# Patient Record
Sex: Male | Born: 1960 | Race: Black or African American | Hispanic: No | Smoking: Former smoker
Health system: Southern US, Community
[De-identification: ages and names within clinical notes are randomized; demographics above are authoritative.]

## PROBLEM LIST (undated history)

## (undated) DIAGNOSIS — F1910 Other psychoactive substance abuse, uncomplicated: Secondary | ICD-10-CM

## (undated) DIAGNOSIS — K759 Inflammatory liver disease, unspecified: Secondary | ICD-10-CM

## (undated) DIAGNOSIS — G4730 Sleep apnea, unspecified: Secondary | ICD-10-CM

## (undated) DIAGNOSIS — M1990 Unspecified osteoarthritis, unspecified site: Secondary | ICD-10-CM

## (undated) DIAGNOSIS — E119 Type 2 diabetes mellitus without complications: Secondary | ICD-10-CM

## (undated) DIAGNOSIS — I10 Essential (primary) hypertension: Secondary | ICD-10-CM

## (undated) DIAGNOSIS — E785 Hyperlipidemia, unspecified: Secondary | ICD-10-CM

## (undated) HISTORY — DX: Inflammatory liver disease, unspecified: K75.9

## (undated) HISTORY — PX: FINGER SURGERY: SHX640

## (undated) HISTORY — DX: Other psychoactive substance abuse, uncomplicated: F19.10

## (undated) HISTORY — DX: Hyperlipidemia, unspecified: E78.5

## (undated) HISTORY — DX: Sleep apnea, unspecified: G47.30

## (undated) HISTORY — PX: COLONOSCOPY: SHX5424

---

## 2010-09-16 ENCOUNTER — Inpatient Hospital Stay (INDEPENDENT_AMBULATORY_CARE_PROVIDER_SITE_OTHER)
Admission: RE | Admit: 2010-09-16 | Discharge: 2010-09-16 | Disposition: A | Discharge status: Home / Self Care | Payer: PRIVATE HEALTH INSURANCE | Source: Ambulatory Visit | Attending: Family Medicine | Admitting: Family Medicine

## 2010-09-16 ENCOUNTER — Ambulatory Visit (INDEPENDENT_AMBULATORY_CARE_PROVIDER_SITE_OTHER): Payer: PRIVATE HEALTH INSURANCE

## 2010-09-16 DIAGNOSIS — M549 Dorsalgia, unspecified: Secondary | ICD-10-CM

## 2011-07-02 ENCOUNTER — Ambulatory Visit: Payer: PRIVATE HEALTH INSURANCE | Admitting: Physical Medicine & Rehabilitation

## 2011-07-09 ENCOUNTER — Encounter: Payer: PRIVATE HEALTH INSURANCE | Admitting: Physical Medicine & Rehabilitation

## 2011-09-06 ENCOUNTER — Encounter
Payer: PRIVATE HEALTH INSURANCE | Attending: Physical Medicine & Rehabilitation | Admitting: Physical Medicine & Rehabilitation

## 2012-12-23 ENCOUNTER — Encounter (HOSPITAL_COMMUNITY): Payer: Self-pay | Admitting: Emergency Medicine

## 2012-12-23 ENCOUNTER — Emergency Department (HOSPITAL_COMMUNITY)
Admission: EM | Admit: 2012-12-23 | Discharge: 2012-12-23 | Disposition: A | Discharge status: Home / Self Care | Payer: Medicare Other | Attending: Emergency Medicine | Admitting: Emergency Medicine

## 2012-12-23 DIAGNOSIS — S0990XA Unspecified injury of head, initial encounter: Secondary | ICD-10-CM | POA: Insufficient documentation

## 2012-12-23 DIAGNOSIS — I10 Essential (primary) hypertension: Secondary | ICD-10-CM | POA: Insufficient documentation

## 2012-12-23 DIAGNOSIS — M545 Low back pain, unspecified: Secondary | ICD-10-CM | POA: Insufficient documentation

## 2012-12-23 DIAGNOSIS — E119 Type 2 diabetes mellitus without complications: Secondary | ICD-10-CM | POA: Insufficient documentation

## 2012-12-23 DIAGNOSIS — F17200 Nicotine dependence, unspecified, uncomplicated: Secondary | ICD-10-CM | POA: Insufficient documentation

## 2012-12-23 DIAGNOSIS — Z79899 Other long term (current) drug therapy: Secondary | ICD-10-CM | POA: Insufficient documentation

## 2012-12-23 DIAGNOSIS — M129 Arthropathy, unspecified: Secondary | ICD-10-CM | POA: Insufficient documentation

## 2012-12-23 HISTORY — DX: Essential (primary) hypertension: I10

## 2012-12-23 HISTORY — DX: Type 2 diabetes mellitus without complications: E11.9

## 2012-12-23 HISTORY — DX: Unspecified osteoarthritis, unspecified site: M19.90

## 2012-12-23 MED ORDER — IBUPROFEN 800 MG PO TABS: 400.0000 mg | ORAL_TABLET | Freq: Three times a day (TID) | ORAL | Status: DC

## 2012-12-23 MED ORDER — HYDROCHLOROTHIAZIDE 25 MG PO TABS: 25.0000 mg | ORAL_TABLET | Freq: Every day | ORAL | Status: DC

## 2012-12-23 MED ORDER — IBUPROFEN 800 MG PO TABS
800.0000 mg | ORAL_TABLET | Freq: Once | ORAL | Status: AC
  Administered 2012-12-23: 800 mg via ORAL
  Filled 2012-12-23: qty 1

## 2012-12-23 MED ORDER — HYDROCHLOROTHIAZIDE 12.5 MG PO CAPS
25.0000 mg | ORAL_CAPSULE | Freq: Once | ORAL | Status: AC
  Administered 2012-12-23: 25 mg via ORAL
  Filled 2012-12-23: qty 2

## 2012-12-23 NOTE — ED Provider Notes (Signed)
Medical screening examination/treatment/procedure(s) were performed by non-physician practitioner and as supervising physician I was immediately available for consultation/collaboration.  Kristen N Ward, DO 12/23/12 2331 
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
## 2013-08-19 ENCOUNTER — Ambulatory Visit
Admission: RE | Admit: 2013-08-19 | Discharge: 2013-08-19 | Disposition: A | Discharge status: Home / Self Care | Payer: Medicare PPO | Source: Ambulatory Visit | Attending: Family Medicine | Admitting: Family Medicine

## 2013-08-19 ENCOUNTER — Other Ambulatory Visit: Payer: Self-pay | Admitting: Family Medicine

## 2013-08-19 DIAGNOSIS — R52 Pain, unspecified: Secondary | ICD-10-CM

## 2013-09-28 ENCOUNTER — Institutional Professional Consult (permissible substitution): Payer: Medicare PPO | Admitting: Internal Medicine

## 2013-10-26 ENCOUNTER — Ambulatory Visit (INDEPENDENT_AMBULATORY_CARE_PROVIDER_SITE_OTHER): Payer: Commercial Managed Care - HMO | Admitting: Pulmonary Disease

## 2013-10-26 ENCOUNTER — Encounter: Payer: Self-pay | Admitting: Pulmonary Disease

## 2013-10-26 ENCOUNTER — Encounter (INDEPENDENT_AMBULATORY_CARE_PROVIDER_SITE_OTHER): Payer: Self-pay

## 2013-10-26 VITALS — BP 132/70 | HR 77 | Temp 97.9°F | Ht 70.0 in | Wt 276.8 lb

## 2013-10-26 DIAGNOSIS — G4733 Obstructive sleep apnea (adult) (pediatric): Secondary | ICD-10-CM

## 2013-10-26 NOTE — Patient Instructions (Signed)
Will schedule for home sleep testing, and will call you once results are available.  Work on weight loss

## 2013-10-26 NOTE — Assessment & Plan Note (Signed)
The patient's history is very suggestive of clinically significant sleep apnea. It had a long discussion with him about the pathophysiology of sleep disordered breathing, including its impact to his quality of life and cardiovascular health. He will need a sleep study for diagnosis, and he is an excellent candidate for home sleep testing. The patient is agreeable to this approach.

## 2013-10-26 NOTE — Progress Notes (Signed)
   Subjective:    Patient ID: Duane Davis, male    DOB: 1961-03-17, 53 y.o.   MRN: [id number]  HPI The patient is a 53 year old male who I've been asked to see for possible obstructive sleep apnea. He had a sleep study in [place] in 2006, but was not able to sleep during the night. He currently has been noted to have loud snoring as well as an abnormal breathing pattern during sleep. He has frequent awakenings at night, and is not rested in the mornings upon arising. He has significant daytime sleepiness with any period of inactivity, and can fall asleep watching TV or reading. He denies sleepiness with driving, but can doze had a long stoplight. The patient states that his weight is down 30 pounds over the last 2 years, and his Epworth score today is 12   Review of Systems  Constitutional: Negative for fever and unexpected weight change.  HENT: Positive for dental problem. Negative for congestion, ear pain, nosebleeds, postnasal drip, rhinorrhea, sinus pressure, sneezing, sore throat and trouble swallowing.   Eyes: Negative for redness and itching.  Respiratory: Positive for shortness of breath. Negative for cough, chest tightness and wheezing.   Cardiovascular: Negative for palpitations and leg swelling.  Gastrointestinal: Negative for nausea and vomiting.  Genitourinary: Negative for dysuria.  Musculoskeletal: Negative for joint swelling.  Skin: Negative for rash.  Neurological: Negative for headaches.  Hematological: Does not bruise/bleed easily.  Psychiatric/Behavioral: Negative for dysphoric mood. The patient is not nervous/anxious.        Objective:   Physical Exam Constitutional:  Obese male, no acute distress  HENT:  Nares patent without discharge  Oropharynx without exudate, palate and uvula are very thick and elongated.  Narrow posterior space  Eyes:  Perrla, eomi, no scleral icterus  Neck:  No JVD, no TMG  Cardiovascular:  Normal rate, regular rhythm, no rubs or  gallops.  No murmurs        Intact distal pulses  Pulmonary :  Normal breath sounds, no stridor or respiratory distress   No rales, rhonchi, or wheezing  Abdominal:  Soft, nondistended, bowel sounds present.  No tenderness noted.   Musculoskeletal:  mild lower extremity edema noted.  Lymph Nodes:  No cervical lymphadenopathy noted  Skin:  No cyanosis noted  Neurologic:  Alert, appropriate, moves all 4 extremities without obvious deficit.         Assessment & Plan:

## 2013-11-10 ENCOUNTER — Telehealth: Payer: Self-pay | Admitting: Internal Medicine

## 2013-11-11 NOTE — Telephone Encounter (Signed)
Mailbox full can not leave a message Joellen Jersey

## 2013-11-15 NOTE — Telephone Encounter (Signed)
This order and info is in [facility] Joellen Jersey

## 2013-11-15 NOTE — Telephone Encounter (Signed)
[name] ° °

## 2013-11-16 NOTE — Telephone Encounter (Signed)
Pt aware ins does not required precert for HST and he will be called in the next few weeks to get set up with machine Joellen Jersey

## 2013-12-07 ENCOUNTER — Telehealth: Payer: Self-pay | Admitting: Pulmonary Disease

## 2013-12-07 NOTE — Telephone Encounter (Signed)
Spoke with the pt and notified of results per CDY  Pt verbalized understanding  Will forward to [facility] to f/u on when he return to the office

## 2013-12-07 NOTE — Telephone Encounter (Signed)
I have looked over the sleep study. He has mild obstructive sleep apnea with a score of 8.3/ hr. Up to 5 is normal, up to 15 is still considered mild, up to 30 is moderate and over 30 would be severe. This is not life-threatening, so I will leave formal interpetation of the study and discussion of options with the patient to be done by Dr Gwenette Greet when he gets back on Monday.

## 2013-12-07 NOTE — Telephone Encounter (Signed)
Pt requesting results of home sleep test performed last week.  Pt aware that Dr Gwenette Greet is not in office, will return next week 12/13/13 Pt states that he does not want to wait that long for the results d/t his increased lack of sleep.  Would like to know if someone can read these results for Dr Gwenette Greet.  Please advise Dr Annamaria Boots as Dr Gwenette Greet is not in office. Thanks.

## 2013-12-13 NOTE — Telephone Encounter (Signed)
Pt aware [name] is seeing patients and we will call him when [facility] advises on recs. Please advise thanks

## 2013-12-13 NOTE — Telephone Encounter (Signed)
Pt is calling back [phone]

## 2013-12-17 NOTE — Telephone Encounter (Signed)
Spoke with pt-- states that he repeated the study last night and turned in the machine this morning. Per pt, this was the 2nd test he's done over night.  Advised the patient there is a short wait period on these results as they have to be loaded into the computer then the Doctor has to read the study.  Will send to Dr Gwenette Greet as Juluis Rainier that the patient has already completed the repeat study last night (12-16-13) and would like the results.

## 2013-12-17 NOTE — Telephone Encounter (Signed)
Have already sent a note to [facility] that his study needs to be repeated. They may have already contacted him.

## 2013-12-17 NOTE — Telephone Encounter (Signed)
Noted  

## 2013-12-17 NOTE — Telephone Encounter (Signed)
Dr Gwenette Greet please advise on sleep study results, thank you.

## 2013-12-22 ENCOUNTER — Other Ambulatory Visit: Payer: Self-pay | Admitting: Pulmonary Disease

## 2013-12-22 DIAGNOSIS — G4733 Obstructive sleep apnea (adult) (pediatric): Secondary | ICD-10-CM

## 2014-02-01 ENCOUNTER — Ambulatory Visit (HOSPITAL_BASED_OUTPATIENT_CLINIC_OR_DEPARTMENT_OTHER): Payer: Medicare HMO | Attending: Pulmonary Disease | Admitting: Radiology

## 2014-02-01 VITALS — Ht 70.0 in | Wt 272.0 lb

## 2014-02-01 DIAGNOSIS — G4733 Obstructive sleep apnea (adult) (pediatric): Secondary | ICD-10-CM | POA: Diagnosis not present

## 2014-02-11 DIAGNOSIS — G4730 Sleep apnea, unspecified: Secondary | ICD-10-CM

## 2014-02-11 NOTE — Progress Notes (Signed)
Needs ov to review sleep study 

## 2014-02-11 NOTE — Progress Notes (Signed)
appt scheduled 02/16/14. Nothing further needed

## 2014-02-11 NOTE — Sleep Study (Signed)
   NAME: Duane Davis DATE OF BIRTH:  1961/04/01 MEDICAL RECORD NUMBER [id number]  [facility]  PHYSICIAN: Zapata OF STUDY: 02/01/2014  SLEEP STUDY TYPE: Nocturnal Polysomnogram               REFERRING PHYSICIAN: Jamonta Goerner, Armando Reichert, MD  INDICATION FOR STUDY: Hypersomnia with sleep apnea  EPWORTH SLEEPINESS SCORE:  19 HEIGHT: 5\' 10"  (177.8 cm)  WEIGHT: 272 lb (123.378 kg)    Body mass index is 39.03 kg/(m^2).  NECK SIZE: 17.5 in.  MEDICATIONS: Reviewed in the sleep record  SLEEP ARCHITECTURE: The patient had a total sleep time of 346 minutes with no slow-wave sleep and only 68 minutes of REM. Sleep onset latency was normal at 9 minutes, and REM onset was normal at 43 minutes. Sleep efficiency was normal at 91%.  RESPIRATORY DATA:  The patient was found to have 16 apneas and 36 obstructive hypopneas, giving him an AHI of 9 events per hour. The events occurred in all body positions, but were definitely increased during REM. His REM AHI was 40 of events per hour. There was moderate snoring noted throughout.  OXYGEN DATA: There was oxygen desaturation as low as 84% with the patient's obstructive events  CARDIAC DATA: No clinically significant arrhythmias were seen  MOVEMENT/PARASOMNIA: No periodic limb movements or other abnormal behaviors were noted.  IMPRESSION/ RECOMMENDATION:    1) mild obstructive sleep apnea/hypopnea syndrome, with an AHI of 9 events per hour and oxygen desaturation as low as 84%. His events occurred primarily during REM, and his REM AHI was 42 events per hour. Treatment for this degree of sleep apnea can include a trial of weight loss alone, upper airway surgery, dental appliance, and also CPAP. Clinical correlation is suggested.    [place], American Board of Sleep Medicine  ELECTRONICALLY SIGNED ON:  02/11/2014, 8:54 AM [place] PH: [phone]   FX: [phone] [place]

## 2014-02-16 ENCOUNTER — Encounter: Payer: Self-pay | Admitting: Pulmonary Disease

## 2014-02-16 ENCOUNTER — Ambulatory Visit (INDEPENDENT_AMBULATORY_CARE_PROVIDER_SITE_OTHER): Payer: Commercial Managed Care - HMO | Admitting: Pulmonary Disease

## 2014-02-16 VITALS — BP 138/76 | HR 93 | Temp 97.8°F | Ht 70.0 in | Wt 282.4 lb

## 2014-02-16 DIAGNOSIS — G4733 Obstructive sleep apnea (adult) (pediatric): Secondary | ICD-10-CM

## 2014-02-16 NOTE — Assessment & Plan Note (Signed)
The patient has mild obstructive sleep apnea by his AHI, but the majority of his events occurred during REM. He is clearly very symptomatic with respect to his symptoms at night and during the day, and I would therefore recommend a trial of CPAP while working on weight loss. The patient is agreeable to this approach.

## 2014-02-16 NOTE — Patient Instructions (Signed)
Will start you on cpap with a moderate pressure level.  Please call if you are having any tolerance issues.  Work weight loss followup with me again in 8 weeks.

## 2014-02-16 NOTE — Progress Notes (Signed)
   Subjective:    Patient ID: Duane Davis, male    DOB: [birth year], 53 y.o.   MRN: [id number]  HPI The patient comes in today for followup of his recent sleep study.  He was found to have mild OSA, with an AHI 9 events per hour and a REM AHI of 40 of events per hour. I have reviewed the study with him in detail, and answered all of his questions.   Review of Systems  Constitutional: Negative for fever and unexpected weight change.  HENT: Negative for congestion, dental problem, ear pain, nosebleeds, postnasal drip, rhinorrhea, sinus pressure, sneezing, sore throat and trouble swallowing.   Eyes: Negative for redness and itching.  Respiratory: Negative for cough, chest tightness, shortness of breath and wheezing.   Cardiovascular: Negative for palpitations and leg swelling.  Gastrointestinal: Negative for nausea and vomiting.  Genitourinary: Negative for dysuria.  Musculoskeletal: Negative for joint swelling.  Skin: Negative for rash.  Neurological: Negative for headaches.  Hematological: Does not bruise/bleed easily.  Psychiatric/Behavioral: Negative for dysphoric mood. The patient is not nervous/anxious.        Objective:   Physical Exam Obese male in no acute distress Nose without purulence or discharge noted Neck without lymphadenopathy or thyromegaly Lower extremities with mild edema, no cyanosis Alert and oriented, moves all 4 extremities.       Assessment & Plan:

## 2014-03-28 ENCOUNTER — Ambulatory Visit: Payer: Commercial Managed Care - HMO | Admitting: Podiatry

## 2014-04-05 ENCOUNTER — Telehealth: Payer: Self-pay | Admitting: Pulmonary Disease

## 2014-04-05 DIAGNOSIS — G4733 Obstructive sleep apnea (adult) (pediatric): Secondary | ICD-10-CM

## 2014-04-05 NOTE — Telephone Encounter (Signed)
Spoke with pt, he is aware of download results.  He is wishing to change auto setting to 5-20.  Order placed.  Nothing further needed.

## 2014-04-05 NOTE — Telephone Encounter (Signed)
Let pt know that his download still shows a little bit of mask leak at times, but not too bad.  Keep working on fit.  He is having some breakthru apnea at times, but not much.  If he feels that he is not getting enough pressure, can change auto setting to 5-20cm so he has more pressure if needed.   If he is ok with this, please send order to dme.

## 2014-04-05 NOTE — Telephone Encounter (Signed)
Spoke with pt. He reports he is wearing CPAP nightly. Pt reports he is still not able to go into a deep sleep and is feeling very tired during the day. Does not seem pressure is enough. Download is in [facility] look at for recs. Please advise thanks

## 2014-04-12 ENCOUNTER — Encounter: Payer: Self-pay | Admitting: Pulmonary Disease

## 2014-04-12 ENCOUNTER — Ambulatory Visit (INDEPENDENT_AMBULATORY_CARE_PROVIDER_SITE_OTHER): Payer: Commercial Managed Care - HMO | Admitting: Pulmonary Disease

## 2014-04-12 VITALS — BP 124/90 | HR 77 | Temp 97.3°F | Ht 74.0 in | Wt 285.6 lb

## 2014-04-12 DIAGNOSIS — G4733 Obstructive sleep apnea (adult) (pediatric): Secondary | ICD-10-CM

## 2014-04-12 NOTE — Patient Instructions (Signed)
Keep working on mask fit, but do not be afraid to try something different if the fit is not right.  Let us know if you are having issues getting the fit right with your homecare company Work on weight loss followup with me again in 2mos.

## 2014-04-12 NOTE — Assessment & Plan Note (Signed)
The patient is doing much better with his C Pap device on the automatic setting. His download shows adequate compliance, and fairly good control of his AHI. Still having some breakthrough at times due to mask leak, and I have discussed with him troubleshooting his mask with his home care company. I've also encouraged him to work aggressively on weight loss.

## 2014-04-12 NOTE — Progress Notes (Signed)
   Subjective:    Patient ID: Duane Davis, male    DOB: July 20, 1960, 53 y.o.   MRN: [id number]  HPI The patient comes in today for follow-up of his obstructive sleep apnea. He was started on C but the last visit, and we have made adjustments to his pressure. He is wearing CPAP compliantly by his download, and has fairly good control of his AHI. He is having some mask leak at times which does cause him to have occasional breakthrough events. The patient states that he is sleeping much better with the device, and has seen significant improvement in his daytime alertness. He is having some issues with his mask fit which does occasionally cause leak.   Review of Systems  Constitutional: Negative for fever and unexpected weight change.  HENT: Negative for congestion, dental problem, ear pain, nosebleeds, postnasal drip, rhinorrhea, sinus pressure, sneezing, sore throat and trouble swallowing.   Eyes: Negative for redness and itching.  Respiratory: Negative for cough, chest tightness, shortness of breath and wheezing.   Cardiovascular: Negative for palpitations and leg swelling.  Gastrointestinal: Negative for nausea and vomiting.  Genitourinary: Negative for dysuria.  Musculoskeletal: Negative for joint swelling.  Skin: Negative for rash.  Neurological: Negative for headaches.  Hematological: Does not bruise/bleed easily.  Psychiatric/Behavioral: Negative for dysphoric mood. The patient is not nervous/anxious.        Objective:   Physical Exam Morbidly obese male in no acute distress Nose without purulence or discharge noted Neck without lymphadenopathy or thyromegaly No skin breakdown or pressure necrosis from the sleep apnea mask Lower extremities with edema noted, no cyanosis Alert and oriented, moves all 4 extremities.       Assessment & Plan:

## 2014-04-18 ENCOUNTER — Ambulatory Visit (INDEPENDENT_AMBULATORY_CARE_PROVIDER_SITE_OTHER): Payer: Commercial Managed Care - HMO

## 2014-04-18 ENCOUNTER — Encounter: Payer: Self-pay | Admitting: Podiatry

## 2014-04-18 ENCOUNTER — Ambulatory Visit (INDEPENDENT_AMBULATORY_CARE_PROVIDER_SITE_OTHER): Payer: Commercial Managed Care - HMO | Admitting: Podiatry

## 2014-04-18 VITALS — BP 147/94 | HR 79 | Resp 18

## 2014-04-18 DIAGNOSIS — M779 Enthesopathy, unspecified: Secondary | ICD-10-CM

## 2014-04-18 DIAGNOSIS — M79673 Pain in unspecified foot: Secondary | ICD-10-CM

## 2014-04-18 DIAGNOSIS — B351 Tinea unguium: Secondary | ICD-10-CM

## 2014-04-18 MED ORDER — TRIAMCINOLONE ACETONIDE 10 MG/ML IJ SUSP
10.0000 mg | Freq: Once | INTRAMUSCULAR | Status: AC
Start: 2014-04-18 — End: 2014-04-18
  Administered 2014-04-18: 10 mg

## 2014-04-18 NOTE — Progress Notes (Signed)
   Subjective:    Patient ID: Duane Davis, male    DOB: 12-01-1960, 53 y.o.   MRN: [id number]  HPI Comments: "Pain on the outside"  Patient c/o aching 5th MPJ right over left for several months. There are callused areas. Painful to walk a lot. He's been filing the calluses down, but no help.  Foot Pain      Review of Systems  Respiratory: Positive for shortness of breath.   Musculoskeletal: Positive for back pain and gait problem.  All other systems reviewed and are negative.      Objective:   Physical Exam        Assessment & Plan:

## 2014-04-19 NOTE — Progress Notes (Signed)
Subjective:     Patient ID: Duane Davis, male   DOB: 1961/03/13, 53 y.o.   MRN: [id number]  HPI patient is found to have pain underneath the fifth metatarsal head of both feet with inflammation and fluid buildup and lesion formation. Also is noted to have significant nail disease with thickness 1-5 both feet   Review of Systems  All other systems reviewed and are negative.      Objective:   Physical Exam  Constitutional: He is oriented to person, place, and time.  Cardiovascular: Intact distal pulses.   Musculoskeletal: Normal range of motion.  Neurological: He is oriented to person, place, and time.  Skin: Skin is warm and dry.  Nursing note and vitals reviewed.  neurovascular status found to be intact with muscle strength adequate and range of motion subtalar and midtarsal joint within normal limits. Patient is noted to have pain in the fifth metatarsal heads of both feet with fluid buildup underneath the plantar capsule and pain when pressed. 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 Today I went ahead and injected the plantar capsule fifth MPJ of both feet 3 mg Kenalog 5 mg Xylocaine and debrided the lesions and advised patient on nail care. Patient will be seen back as needed

## 2014-05-17 DIAGNOSIS — F1120 Opioid dependence, uncomplicated: Secondary | ICD-10-CM | POA: Diagnosis not present

## 2014-05-31 DIAGNOSIS — I10 Essential (primary) hypertension: Secondary | ICD-10-CM | POA: Diagnosis not present

## 2014-05-31 DIAGNOSIS — G4733 Obstructive sleep apnea (adult) (pediatric): Secondary | ICD-10-CM | POA: Diagnosis not present

## 2014-05-31 DIAGNOSIS — G4710 Hypersomnia, unspecified: Secondary | ICD-10-CM | POA: Diagnosis not present

## 2014-06-10 DIAGNOSIS — F1120 Opioid dependence, uncomplicated: Secondary | ICD-10-CM | POA: Diagnosis not present

## 2014-06-13 DIAGNOSIS — F1120 Opioid dependence, uncomplicated: Secondary | ICD-10-CM | POA: Diagnosis not present

## 2014-06-14 DIAGNOSIS — F1120 Opioid dependence, uncomplicated: Secondary | ICD-10-CM | POA: Diagnosis not present

## 2014-06-20 DIAGNOSIS — F1120 Opioid dependence, uncomplicated: Secondary | ICD-10-CM | POA: Diagnosis not present

## 2014-07-01 DIAGNOSIS — G4733 Obstructive sleep apnea (adult) (pediatric): Secondary | ICD-10-CM | POA: Diagnosis not present

## 2014-07-01 DIAGNOSIS — G4710 Hypersomnia, unspecified: Secondary | ICD-10-CM | POA: Diagnosis not present

## 2014-07-01 DIAGNOSIS — I10 Essential (primary) hypertension: Secondary | ICD-10-CM | POA: Diagnosis not present

## 2014-07-04 DIAGNOSIS — F1120 Opioid dependence, uncomplicated: Secondary | ICD-10-CM | POA: Diagnosis not present

## 2014-07-06 DIAGNOSIS — F1120 Opioid dependence, uncomplicated: Secondary | ICD-10-CM | POA: Diagnosis not present

## 2014-07-11 ENCOUNTER — Ambulatory Visit (INDEPENDENT_AMBULATORY_CARE_PROVIDER_SITE_OTHER): Payer: Commercial Managed Care - HMO | Admitting: Podiatry

## 2014-07-11 DIAGNOSIS — M779 Enthesopathy, unspecified: Secondary | ICD-10-CM | POA: Diagnosis not present

## 2014-07-11 DIAGNOSIS — M79673 Pain in unspecified foot: Secondary | ICD-10-CM

## 2014-07-11 DIAGNOSIS — F1120 Opioid dependence, uncomplicated: Secondary | ICD-10-CM | POA: Diagnosis not present

## 2014-07-11 MED ORDER — TRIAMCINOLONE ACETONIDE 10 MG/ML IJ SUSP
10.0000 mg | Freq: Once | INTRAMUSCULAR | Status: AC
Start: 2014-07-11 — End: 2014-07-11
  Administered 2014-07-11: 10 mg

## 2014-07-12 DIAGNOSIS — F1120 Opioid dependence, uncomplicated: Secondary | ICD-10-CM | POA: Diagnosis not present

## 2014-07-12 NOTE — Progress Notes (Signed)
Subjective:     Patient ID: Duane Davis, male   DOB: 1960/07/08, 54 y.o.   MRN: [id number]  HPI patient presents with inflammation around the right fifth metatarsal head with fluid buildup noted and pain with palpation   Review of Systems     Objective:   Physical Exam Neurovascular status intact with thick keratotic lesion fifth MPJ right and fluid buildup noted with palpation and visual    Assessment:     Inflammatory capsulitis fifth MPJ right with pain    Plan:     Injection of the right fifth MPJ 3 mg Dexon some Kenalog 5 mg Xylocaine and debrided lesion

## 2014-07-18 DIAGNOSIS — F1120 Opioid dependence, uncomplicated: Secondary | ICD-10-CM | POA: Diagnosis not present

## 2014-07-26 DIAGNOSIS — F1120 Opioid dependence, uncomplicated: Secondary | ICD-10-CM | POA: Diagnosis not present

## 2014-07-30 DIAGNOSIS — G4733 Obstructive sleep apnea (adult) (pediatric): Secondary | ICD-10-CM | POA: Diagnosis not present

## 2014-07-30 DIAGNOSIS — I10 Essential (primary) hypertension: Secondary | ICD-10-CM | POA: Diagnosis not present

## 2014-07-30 DIAGNOSIS — G4710 Hypersomnia, unspecified: Secondary | ICD-10-CM | POA: Diagnosis not present

## 2014-08-01 DIAGNOSIS — F1120 Opioid dependence, uncomplicated: Secondary | ICD-10-CM | POA: Diagnosis not present

## 2014-08-08 DIAGNOSIS — F1120 Opioid dependence, uncomplicated: Secondary | ICD-10-CM | POA: Diagnosis not present

## 2014-08-15 DIAGNOSIS — F1120 Opioid dependence, uncomplicated: Secondary | ICD-10-CM | POA: Diagnosis not present

## 2014-08-16 DIAGNOSIS — F1120 Opioid dependence, uncomplicated: Secondary | ICD-10-CM | POA: Diagnosis not present

## 2014-08-22 DIAGNOSIS — F1120 Opioid dependence, uncomplicated: Secondary | ICD-10-CM | POA: Diagnosis not present

## 2014-08-29 DIAGNOSIS — F1120 Opioid dependence, uncomplicated: Secondary | ICD-10-CM | POA: Diagnosis not present

## 2014-08-30 DIAGNOSIS — G4733 Obstructive sleep apnea (adult) (pediatric): Secondary | ICD-10-CM | POA: Diagnosis not present

## 2014-08-30 DIAGNOSIS — G4710 Hypersomnia, unspecified: Secondary | ICD-10-CM | POA: Diagnosis not present

## 2014-08-30 DIAGNOSIS — I10 Essential (primary) hypertension: Secondary | ICD-10-CM | POA: Diagnosis not present

## 2014-09-05 DIAGNOSIS — F1120 Opioid dependence, uncomplicated: Secondary | ICD-10-CM | POA: Diagnosis not present

## 2014-09-07 DIAGNOSIS — F1120 Opioid dependence, uncomplicated: Secondary | ICD-10-CM | POA: Diagnosis not present

## 2014-09-12 DIAGNOSIS — F1120 Opioid dependence, uncomplicated: Secondary | ICD-10-CM | POA: Diagnosis not present

## 2014-09-13 DIAGNOSIS — F1120 Opioid dependence, uncomplicated: Secondary | ICD-10-CM | POA: Diagnosis not present

## 2014-09-15 DIAGNOSIS — F1120 Opioid dependence, uncomplicated: Secondary | ICD-10-CM | POA: Diagnosis not present

## 2014-09-19 DIAGNOSIS — F1120 Opioid dependence, uncomplicated: Secondary | ICD-10-CM | POA: Diagnosis not present

## 2014-09-21 DIAGNOSIS — E785 Hyperlipidemia, unspecified: Secondary | ICD-10-CM | POA: Diagnosis not present

## 2014-09-21 DIAGNOSIS — E669 Obesity, unspecified: Secondary | ICD-10-CM | POA: Diagnosis not present

## 2014-09-21 DIAGNOSIS — I10 Essential (primary) hypertension: Secondary | ICD-10-CM | POA: Diagnosis not present

## 2014-09-21 DIAGNOSIS — E118 Type 2 diabetes mellitus with unspecified complications: Secondary | ICD-10-CM | POA: Diagnosis not present

## 2014-09-21 DIAGNOSIS — M545 Low back pain: Secondary | ICD-10-CM | POA: Diagnosis not present

## 2014-09-21 DIAGNOSIS — R7309 Other abnormal glucose: Secondary | ICD-10-CM | POA: Diagnosis not present

## 2014-09-26 DIAGNOSIS — F1120 Opioid dependence, uncomplicated: Secondary | ICD-10-CM | POA: Diagnosis not present

## 2014-09-29 DIAGNOSIS — I10 Essential (primary) hypertension: Secondary | ICD-10-CM | POA: Diagnosis not present

## 2014-09-29 DIAGNOSIS — G4733 Obstructive sleep apnea (adult) (pediatric): Secondary | ICD-10-CM | POA: Diagnosis not present

## 2014-09-29 DIAGNOSIS — G4710 Hypersomnia, unspecified: Secondary | ICD-10-CM | POA: Diagnosis not present

## 2014-10-03 DIAGNOSIS — F1120 Opioid dependence, uncomplicated: Secondary | ICD-10-CM | POA: Diagnosis not present

## 2014-10-11 ENCOUNTER — Ambulatory Visit: Payer: Commercial Managed Care - HMO | Admitting: Pulmonary Disease

## 2014-10-17 ENCOUNTER — Ambulatory Visit (INDEPENDENT_AMBULATORY_CARE_PROVIDER_SITE_OTHER): Payer: Commercial Managed Care - HMO | Admitting: Pulmonary Disease

## 2014-10-17 ENCOUNTER — Encounter: Payer: Self-pay | Admitting: Pulmonary Disease

## 2014-10-17 ENCOUNTER — Encounter (INDEPENDENT_AMBULATORY_CARE_PROVIDER_SITE_OTHER): Payer: Self-pay

## 2014-10-17 DIAGNOSIS — G4733 Obstructive sleep apnea (adult) (pediatric): Secondary | ICD-10-CM

## 2014-10-17 DIAGNOSIS — F1120 Opioid dependence, uncomplicated: Secondary | ICD-10-CM | POA: Diagnosis not present

## 2014-10-17 NOTE — Progress Notes (Signed)
   Subjective:    Patient ID: Duane Davis, male    DOB: 23-Feb-1961, 54 y.o.   MRN: [id number]  HPI Patient comes in today for follow-up of his obstructive sleep apnea. He is wearing C Pap compliantly by his download, but is having some breakthrough apneas because of significant mask leak. He has worked with his home care company on fit, and has kept up with his cushion changes to no avail. He feels that he sleeps much better when wearing the device, with improved daytime alertness.   Review of Systems  Constitutional: Negative for fever and unexpected weight change.  HENT: Negative for congestion, dental problem, ear pain, nosebleeds, postnasal drip, rhinorrhea, sinus pressure, sneezing, sore throat and trouble swallowing.   Eyes: Negative for redness and itching.  Respiratory: Negative for cough, chest tightness, shortness of breath and wheezing.   Cardiovascular: Negative for palpitations and leg swelling.  Gastrointestinal: Negative for nausea and vomiting.  Genitourinary: Negative for dysuria.  Musculoskeletal: Negative for joint swelling.  Skin: Negative for rash.  Neurological: Negative for headaches.  Hematological: Does not bruise/bleed easily.  Psychiatric/Behavioral: Negative for dysphoric mood. The patient is not nervous/anxious.        Objective:   Physical Exam Obese male in no acute distress Nose without purulence or discharge noted Neck without lymphadenopathy or thyromegaly No skin breakdown or pressure necrosis from the C Pap mask Lower extremities without edema, no cyanosis Alert and oriented, moves all 4 extremities.       Assessment & Plan:

## 2014-10-17 NOTE — Patient Instructions (Signed)
Will refer to sleep center for a new mask fitting.  Please take your mask with you to the apptm Keep working on weight loss followup with Dr. Halford Chessman in one year, but call if not doing well with cpap.

## 2014-10-17 NOTE — Assessment & Plan Note (Signed)
The patient is wearing C Pap compliantly by his download, but is having some breakthrough events because of significant mask leak. I have recommended that he go to the sleep Center for a formal mask fitting, so encouraged him to work aggressively on weight loss.

## 2014-10-19 DIAGNOSIS — F1120 Opioid dependence, uncomplicated: Secondary | ICD-10-CM | POA: Diagnosis not present

## 2014-10-26 ENCOUNTER — Ambulatory Visit (HOSPITAL_BASED_OUTPATIENT_CLINIC_OR_DEPARTMENT_OTHER): Payer: Commercial Managed Care - HMO | Attending: Pulmonary Disease | Admitting: Radiology

## 2014-10-26 ENCOUNTER — Other Ambulatory Visit (HOSPITAL_BASED_OUTPATIENT_CLINIC_OR_DEPARTMENT_OTHER): Payer: Commercial Managed Care - HMO

## 2014-10-26 DIAGNOSIS — Z9989 Dependence on other enabling machines and devices: Principal | ICD-10-CM

## 2014-10-26 DIAGNOSIS — G4733 Obstructive sleep apnea (adult) (pediatric): Secondary | ICD-10-CM

## 2014-10-28 DIAGNOSIS — F1120 Opioid dependence, uncomplicated: Secondary | ICD-10-CM | POA: Diagnosis not present

## 2014-10-30 DIAGNOSIS — G4733 Obstructive sleep apnea (adult) (pediatric): Secondary | ICD-10-CM | POA: Diagnosis not present

## 2014-10-30 DIAGNOSIS — I10 Essential (primary) hypertension: Secondary | ICD-10-CM | POA: Diagnosis not present

## 2014-10-30 DIAGNOSIS — G4710 Hypersomnia, unspecified: Secondary | ICD-10-CM | POA: Diagnosis not present

## 2014-11-01 DIAGNOSIS — F1120 Opioid dependence, uncomplicated: Secondary | ICD-10-CM | POA: Diagnosis not present

## 2014-11-07 DIAGNOSIS — F1120 Opioid dependence, uncomplicated: Secondary | ICD-10-CM | POA: Diagnosis not present

## 2014-11-21 DIAGNOSIS — F1120 Opioid dependence, uncomplicated: Secondary | ICD-10-CM | POA: Diagnosis not present

## 2014-11-28 DIAGNOSIS — F1120 Opioid dependence, uncomplicated: Secondary | ICD-10-CM | POA: Diagnosis not present

## 2014-11-29 DIAGNOSIS — I10 Essential (primary) hypertension: Secondary | ICD-10-CM | POA: Diagnosis not present

## 2014-11-29 DIAGNOSIS — G4733 Obstructive sleep apnea (adult) (pediatric): Secondary | ICD-10-CM | POA: Diagnosis not present

## 2014-11-29 DIAGNOSIS — F1120 Opioid dependence, uncomplicated: Secondary | ICD-10-CM | POA: Diagnosis not present

## 2014-11-29 DIAGNOSIS — G4710 Hypersomnia, unspecified: Secondary | ICD-10-CM | POA: Diagnosis not present

## 2014-11-30 DIAGNOSIS — G4733 Obstructive sleep apnea (adult) (pediatric): Secondary | ICD-10-CM | POA: Diagnosis not present

## 2014-12-05 DIAGNOSIS — F1120 Opioid dependence, uncomplicated: Secondary | ICD-10-CM | POA: Diagnosis not present

## 2014-12-19 DIAGNOSIS — F1120 Opioid dependence, uncomplicated: Secondary | ICD-10-CM | POA: Diagnosis not present

## 2014-12-20 DIAGNOSIS — R7309 Other abnormal glucose: Secondary | ICD-10-CM | POA: Diagnosis not present

## 2014-12-20 DIAGNOSIS — E118 Type 2 diabetes mellitus with unspecified complications: Secondary | ICD-10-CM | POA: Diagnosis not present

## 2014-12-20 DIAGNOSIS — F1120 Opioid dependence, uncomplicated: Secondary | ICD-10-CM | POA: Diagnosis not present

## 2014-12-20 DIAGNOSIS — I10 Essential (primary) hypertension: Secondary | ICD-10-CM | POA: Diagnosis not present

## 2014-12-20 DIAGNOSIS — E669 Obesity, unspecified: Secondary | ICD-10-CM | POA: Diagnosis not present

## 2014-12-26 DIAGNOSIS — F1120 Opioid dependence, uncomplicated: Secondary | ICD-10-CM | POA: Diagnosis not present

## 2014-12-30 DIAGNOSIS — G4710 Hypersomnia, unspecified: Secondary | ICD-10-CM | POA: Diagnosis not present

## 2014-12-30 DIAGNOSIS — I10 Essential (primary) hypertension: Secondary | ICD-10-CM | POA: Diagnosis not present

## 2014-12-30 DIAGNOSIS — G4733 Obstructive sleep apnea (adult) (pediatric): Secondary | ICD-10-CM | POA: Diagnosis not present

## 2015-01-09 DIAGNOSIS — F1120 Opioid dependence, uncomplicated: Secondary | ICD-10-CM | POA: Diagnosis not present

## 2015-01-23 DIAGNOSIS — F1120 Opioid dependence, uncomplicated: Secondary | ICD-10-CM | POA: Diagnosis not present

## 2015-01-26 DIAGNOSIS — F1120 Opioid dependence, uncomplicated: Secondary | ICD-10-CM | POA: Diagnosis not present

## 2015-01-30 DIAGNOSIS — G4710 Hypersomnia, unspecified: Secondary | ICD-10-CM | POA: Diagnosis not present

## 2015-01-30 DIAGNOSIS — I10 Essential (primary) hypertension: Secondary | ICD-10-CM | POA: Diagnosis not present

## 2015-01-30 DIAGNOSIS — G4733 Obstructive sleep apnea (adult) (pediatric): Secondary | ICD-10-CM | POA: Diagnosis not present

## 2015-01-30 DIAGNOSIS — F1120 Opioid dependence, uncomplicated: Secondary | ICD-10-CM | POA: Diagnosis not present

## 2015-02-06 DIAGNOSIS — F1120 Opioid dependence, uncomplicated: Secondary | ICD-10-CM | POA: Diagnosis not present

## 2015-02-07 DIAGNOSIS — F1120 Opioid dependence, uncomplicated: Secondary | ICD-10-CM | POA: Diagnosis not present

## 2015-02-13 DIAGNOSIS — F1120 Opioid dependence, uncomplicated: Secondary | ICD-10-CM | POA: Diagnosis not present

## 2015-02-20 DIAGNOSIS — F1120 Opioid dependence, uncomplicated: Secondary | ICD-10-CM | POA: Diagnosis not present

## 2015-02-27 DIAGNOSIS — F1120 Opioid dependence, uncomplicated: Secondary | ICD-10-CM | POA: Diagnosis not present

## 2015-02-28 DIAGNOSIS — F1120 Opioid dependence, uncomplicated: Secondary | ICD-10-CM | POA: Diagnosis not present

## 2015-03-01 DIAGNOSIS — G4710 Hypersomnia, unspecified: Secondary | ICD-10-CM | POA: Diagnosis not present

## 2015-03-01 DIAGNOSIS — I10 Essential (primary) hypertension: Secondary | ICD-10-CM | POA: Diagnosis not present

## 2015-03-01 DIAGNOSIS — G4733 Obstructive sleep apnea (adult) (pediatric): Secondary | ICD-10-CM | POA: Diagnosis not present

## 2015-03-06 DIAGNOSIS — F1120 Opioid dependence, uncomplicated: Secondary | ICD-10-CM | POA: Diagnosis not present

## 2015-03-08 ENCOUNTER — Ambulatory Visit: Payer: Commercial Managed Care - HMO | Admitting: Podiatry

## 2015-03-13 DIAGNOSIS — F1120 Opioid dependence, uncomplicated: Secondary | ICD-10-CM | POA: Diagnosis not present

## 2015-03-20 DIAGNOSIS — F1120 Opioid dependence, uncomplicated: Secondary | ICD-10-CM | POA: Diagnosis not present

## 2015-03-22 ENCOUNTER — Encounter: Payer: Self-pay | Admitting: Podiatry

## 2015-03-22 ENCOUNTER — Ambulatory Visit (INDEPENDENT_AMBULATORY_CARE_PROVIDER_SITE_OTHER): Payer: Commercial Managed Care - HMO | Admitting: Podiatry

## 2015-03-22 DIAGNOSIS — M2042 Other hammer toe(s) (acquired), left foot: Secondary | ICD-10-CM | POA: Diagnosis not present

## 2015-03-22 DIAGNOSIS — E118 Type 2 diabetes mellitus with unspecified complications: Secondary | ICD-10-CM | POA: Diagnosis not present

## 2015-03-22 DIAGNOSIS — L84 Corns and callosities: Secondary | ICD-10-CM | POA: Diagnosis not present

## 2015-03-22 DIAGNOSIS — N529 Male erectile dysfunction, unspecified: Secondary | ICD-10-CM | POA: Diagnosis not present

## 2015-03-22 DIAGNOSIS — E119 Type 2 diabetes mellitus without complications: Secondary | ICD-10-CM | POA: Diagnosis not present

## 2015-03-22 DIAGNOSIS — Z6841 Body Mass Index (BMI) 40.0 and over, adult: Secondary | ICD-10-CM | POA: Diagnosis not present

## 2015-03-22 NOTE — Progress Notes (Signed)
Subjective:     Patient ID: Duane Davis, male   DOB: [birth year], 54 y.o.   MRN: [id number]  HPI patient presents with hammertoe deformity fifth left and keratotic lesions fifth digits   Review of Systems     Objective:   Physical Exam  neurovascular status intact with thick keratotic lesions bilateral    Assessment:      lesion secondary to pressure along with keratotic tissue and hammertoe deformity    Plan:      reviewed condition and debrided lesions and advised on digital correction which may need to be done in the future. Reappoint to recheck

## 2015-03-23 ENCOUNTER — Telehealth: Payer: Self-pay | Admitting: *Deleted

## 2015-03-23 NOTE — Telephone Encounter (Signed)
Pt states he was referred to his primary doctor for diabetic shoe referral, the primary doctor said Dr. Paulla Dolly would have to send paperwork to Dr. Berdine Addison at [facility].

## 2015-03-28 DIAGNOSIS — F1120 Opioid dependence, uncomplicated: Secondary | ICD-10-CM | POA: Diagnosis not present

## 2015-03-30 DIAGNOSIS — F1120 Opioid dependence, uncomplicated: Secondary | ICD-10-CM | POA: Diagnosis not present

## 2015-04-03 DIAGNOSIS — F1120 Opioid dependence, uncomplicated: Secondary | ICD-10-CM | POA: Diagnosis not present

## 2015-04-10 DIAGNOSIS — F1120 Opioid dependence, uncomplicated: Secondary | ICD-10-CM | POA: Diagnosis not present

## 2015-04-18 DIAGNOSIS — F1120 Opioid dependence, uncomplicated: Secondary | ICD-10-CM | POA: Diagnosis not present

## 2015-04-24 DIAGNOSIS — F1120 Opioid dependence, uncomplicated: Secondary | ICD-10-CM | POA: Diagnosis not present

## 2015-05-01 DIAGNOSIS — F1120 Opioid dependence, uncomplicated: Secondary | ICD-10-CM | POA: Diagnosis not present

## 2015-05-03 DIAGNOSIS — F1120 Opioid dependence, uncomplicated: Secondary | ICD-10-CM | POA: Diagnosis not present

## 2015-05-04 DIAGNOSIS — F1120 Opioid dependence, uncomplicated: Secondary | ICD-10-CM | POA: Diagnosis not present

## 2015-05-18 ENCOUNTER — Telehealth: Payer: Self-pay | Admitting: *Deleted

## 2015-05-18 NOTE — Telephone Encounter (Signed)
Error

## 2015-05-23 DIAGNOSIS — F1120 Opioid dependence, uncomplicated: Secondary | ICD-10-CM | POA: Diagnosis not present

## 2015-05-31 DIAGNOSIS — E291 Testicular hypofunction: Secondary | ICD-10-CM | POA: Diagnosis not present

## 2015-05-31 DIAGNOSIS — Z0000 Encounter for general adult medical examination without abnormal findings: Secondary | ICD-10-CM | POA: Diagnosis not present

## 2015-06-06 DIAGNOSIS — E291 Testicular hypofunction: Secondary | ICD-10-CM | POA: Diagnosis not present

## 2015-06-12 DIAGNOSIS — F1120 Opioid dependence, uncomplicated: Secondary | ICD-10-CM | POA: Diagnosis not present

## 2015-06-13 DIAGNOSIS — F1120 Opioid dependence, uncomplicated: Secondary | ICD-10-CM | POA: Diagnosis not present

## 2015-06-19 DIAGNOSIS — Z0000 Encounter for general adult medical examination without abnormal findings: Secondary | ICD-10-CM | POA: Diagnosis not present

## 2015-06-19 DIAGNOSIS — E291 Testicular hypofunction: Secondary | ICD-10-CM | POA: Diagnosis not present

## 2015-06-19 DIAGNOSIS — F1120 Opioid dependence, uncomplicated: Secondary | ICD-10-CM | POA: Diagnosis not present

## 2015-06-21 DIAGNOSIS — I10 Essential (primary) hypertension: Secondary | ICD-10-CM | POA: Diagnosis not present

## 2015-06-21 DIAGNOSIS — E118 Type 2 diabetes mellitus with unspecified complications: Secondary | ICD-10-CM | POA: Diagnosis not present

## 2015-06-22 DIAGNOSIS — F1120 Opioid dependence, uncomplicated: Secondary | ICD-10-CM | POA: Diagnosis not present

## 2015-06-23 DIAGNOSIS — E291 Testicular hypofunction: Secondary | ICD-10-CM | POA: Diagnosis not present

## 2015-06-27 DIAGNOSIS — E291 Testicular hypofunction: Secondary | ICD-10-CM | POA: Diagnosis not present

## 2015-06-30 DIAGNOSIS — F1120 Opioid dependence, uncomplicated: Secondary | ICD-10-CM | POA: Diagnosis not present

## 2015-07-04 DIAGNOSIS — E291 Testicular hypofunction: Secondary | ICD-10-CM | POA: Diagnosis not present

## 2015-07-10 DIAGNOSIS — F1120 Opioid dependence, uncomplicated: Secondary | ICD-10-CM | POA: Diagnosis not present

## 2015-07-17 DIAGNOSIS — F1120 Opioid dependence, uncomplicated: Secondary | ICD-10-CM | POA: Diagnosis not present

## 2015-07-19 DIAGNOSIS — F1120 Opioid dependence, uncomplicated: Secondary | ICD-10-CM | POA: Diagnosis not present

## 2015-07-24 DIAGNOSIS — F1120 Opioid dependence, uncomplicated: Secondary | ICD-10-CM | POA: Diagnosis not present

## 2015-07-31 DIAGNOSIS — F1120 Opioid dependence, uncomplicated: Secondary | ICD-10-CM | POA: Diagnosis not present

## 2015-08-07 DIAGNOSIS — F1120 Opioid dependence, uncomplicated: Secondary | ICD-10-CM | POA: Diagnosis not present

## 2015-08-08 DIAGNOSIS — F1120 Opioid dependence, uncomplicated: Secondary | ICD-10-CM | POA: Diagnosis not present

## 2015-08-14 DIAGNOSIS — F1120 Opioid dependence, uncomplicated: Secondary | ICD-10-CM | POA: Diagnosis not present

## 2015-08-21 DIAGNOSIS — F1120 Opioid dependence, uncomplicated: Secondary | ICD-10-CM | POA: Diagnosis not present

## 2015-08-22 DIAGNOSIS — I10 Essential (primary) hypertension: Secondary | ICD-10-CM | POA: Diagnosis not present

## 2015-08-22 DIAGNOSIS — E119 Type 2 diabetes mellitus without complications: Secondary | ICD-10-CM | POA: Diagnosis not present

## 2015-08-22 DIAGNOSIS — R5382 Chronic fatigue, unspecified: Secondary | ICD-10-CM | POA: Diagnosis not present

## 2015-08-22 DIAGNOSIS — F1120 Opioid dependence, uncomplicated: Secondary | ICD-10-CM | POA: Diagnosis not present

## 2015-08-24 DIAGNOSIS — G4733 Obstructive sleep apnea (adult) (pediatric): Secondary | ICD-10-CM | POA: Diagnosis not present

## 2015-08-31 DIAGNOSIS — F1120 Opioid dependence, uncomplicated: Secondary | ICD-10-CM | POA: Diagnosis not present

## 2015-09-04 DIAGNOSIS — F1120 Opioid dependence, uncomplicated: Secondary | ICD-10-CM | POA: Diagnosis not present

## 2015-09-13 DIAGNOSIS — F1120 Opioid dependence, uncomplicated: Secondary | ICD-10-CM | POA: Diagnosis not present

## 2015-09-18 DIAGNOSIS — F1120 Opioid dependence, uncomplicated: Secondary | ICD-10-CM | POA: Diagnosis not present

## 2015-09-21 DIAGNOSIS — F1120 Opioid dependence, uncomplicated: Secondary | ICD-10-CM | POA: Diagnosis not present

## 2015-10-02 DIAGNOSIS — F1120 Opioid dependence, uncomplicated: Secondary | ICD-10-CM | POA: Diagnosis not present

## 2015-10-03 DIAGNOSIS — F1120 Opioid dependence, uncomplicated: Secondary | ICD-10-CM | POA: Diagnosis not present

## 2015-10-16 DIAGNOSIS — F1120 Opioid dependence, uncomplicated: Secondary | ICD-10-CM | POA: Diagnosis not present

## 2015-10-23 DIAGNOSIS — F1120 Opioid dependence, uncomplicated: Secondary | ICD-10-CM | POA: Diagnosis not present

## 2015-10-30 DIAGNOSIS — F1120 Opioid dependence, uncomplicated: Secondary | ICD-10-CM | POA: Diagnosis not present

## 2015-10-31 DIAGNOSIS — F1120 Opioid dependence, uncomplicated: Secondary | ICD-10-CM | POA: Diagnosis not present

## 2015-11-01 DIAGNOSIS — F1120 Opioid dependence, uncomplicated: Secondary | ICD-10-CM | POA: Diagnosis not present

## 2015-11-15 DIAGNOSIS — F1120 Opioid dependence, uncomplicated: Secondary | ICD-10-CM | POA: Diagnosis not present

## 2015-11-20 DIAGNOSIS — F1120 Opioid dependence, uncomplicated: Secondary | ICD-10-CM | POA: Diagnosis not present

## 2015-11-21 DIAGNOSIS — F1120 Opioid dependence, uncomplicated: Secondary | ICD-10-CM | POA: Diagnosis not present

## 2015-11-21 DIAGNOSIS — I10 Essential (primary) hypertension: Secondary | ICD-10-CM | POA: Diagnosis not present

## 2015-11-21 DIAGNOSIS — E118 Type 2 diabetes mellitus with unspecified complications: Secondary | ICD-10-CM | POA: Diagnosis not present

## 2015-11-23 DIAGNOSIS — G4733 Obstructive sleep apnea (adult) (pediatric): Secondary | ICD-10-CM | POA: Diagnosis not present

## 2015-12-04 DIAGNOSIS — F1120 Opioid dependence, uncomplicated: Secondary | ICD-10-CM | POA: Diagnosis not present

## 2015-12-11 DIAGNOSIS — F1120 Opioid dependence, uncomplicated: Secondary | ICD-10-CM | POA: Diagnosis not present

## 2015-12-18 DIAGNOSIS — F1120 Opioid dependence, uncomplicated: Secondary | ICD-10-CM | POA: Diagnosis not present

## 2015-12-25 DIAGNOSIS — F1120 Opioid dependence, uncomplicated: Secondary | ICD-10-CM | POA: Diagnosis not present

## 2016-01-08 DIAGNOSIS — F1120 Opioid dependence, uncomplicated: Secondary | ICD-10-CM | POA: Diagnosis not present

## 2016-01-09 DIAGNOSIS — F1120 Opioid dependence, uncomplicated: Secondary | ICD-10-CM | POA: Diagnosis not present

## 2016-01-22 DIAGNOSIS — F1120 Opioid dependence, uncomplicated: Secondary | ICD-10-CM | POA: Diagnosis not present

## 2016-01-29 DIAGNOSIS — F1120 Opioid dependence, uncomplicated: Secondary | ICD-10-CM | POA: Diagnosis not present

## 2016-01-31 DIAGNOSIS — F1120 Opioid dependence, uncomplicated: Secondary | ICD-10-CM | POA: Diagnosis not present

## 2016-02-05 DIAGNOSIS — E291 Testicular hypofunction: Secondary | ICD-10-CM | POA: Diagnosis not present

## 2016-02-05 DIAGNOSIS — F1120 Opioid dependence, uncomplicated: Secondary | ICD-10-CM | POA: Diagnosis not present

## 2016-02-05 DIAGNOSIS — N5201 Erectile dysfunction due to arterial insufficiency: Secondary | ICD-10-CM | POA: Diagnosis not present

## 2016-02-12 DIAGNOSIS — F1120 Opioid dependence, uncomplicated: Secondary | ICD-10-CM | POA: Diagnosis not present

## 2016-02-12 DIAGNOSIS — E291 Testicular hypofunction: Secondary | ICD-10-CM | POA: Diagnosis not present

## 2016-02-13 DIAGNOSIS — F1120 Opioid dependence, uncomplicated: Secondary | ICD-10-CM | POA: Diagnosis not present

## 2016-02-19 DIAGNOSIS — F1120 Opioid dependence, uncomplicated: Secondary | ICD-10-CM | POA: Diagnosis not present

## 2016-02-20 DIAGNOSIS — E669 Obesity, unspecified: Secondary | ICD-10-CM | POA: Diagnosis not present

## 2016-02-20 DIAGNOSIS — I10 Essential (primary) hypertension: Secondary | ICD-10-CM | POA: Diagnosis not present

## 2016-02-20 DIAGNOSIS — N183 Chronic kidney disease, stage 3 (moderate): Secondary | ICD-10-CM | POA: Diagnosis not present

## 2016-02-20 DIAGNOSIS — E1122 Type 2 diabetes mellitus with diabetic chronic kidney disease: Secondary | ICD-10-CM | POA: Diagnosis not present

## 2016-02-22 DIAGNOSIS — H524 Presbyopia: Secondary | ICD-10-CM | POA: Diagnosis not present

## 2016-02-26 DIAGNOSIS — F1120 Opioid dependence, uncomplicated: Secondary | ICD-10-CM | POA: Diagnosis not present

## 2016-02-26 DIAGNOSIS — G4733 Obstructive sleep apnea (adult) (pediatric): Secondary | ICD-10-CM | POA: Diagnosis not present

## 2016-03-04 DIAGNOSIS — F1120 Opioid dependence, uncomplicated: Secondary | ICD-10-CM | POA: Diagnosis not present

## 2016-03-11 DIAGNOSIS — F1120 Opioid dependence, uncomplicated: Secondary | ICD-10-CM | POA: Diagnosis not present

## 2016-03-18 DIAGNOSIS — F1120 Opioid dependence, uncomplicated: Secondary | ICD-10-CM | POA: Diagnosis not present

## 2016-03-25 DIAGNOSIS — F1120 Opioid dependence, uncomplicated: Secondary | ICD-10-CM | POA: Diagnosis not present

## 2016-04-01 DIAGNOSIS — F1120 Opioid dependence, uncomplicated: Secondary | ICD-10-CM | POA: Diagnosis not present

## 2016-04-02 DIAGNOSIS — F1120 Opioid dependence, uncomplicated: Secondary | ICD-10-CM | POA: Diagnosis not present

## 2016-04-08 DIAGNOSIS — F1120 Opioid dependence, uncomplicated: Secondary | ICD-10-CM | POA: Diagnosis not present

## 2016-04-10 DIAGNOSIS — Z0000 Encounter for general adult medical examination without abnormal findings: Secondary | ICD-10-CM | POA: Diagnosis not present

## 2016-04-10 DIAGNOSIS — Z125 Encounter for screening for malignant neoplasm of prostate: Secondary | ICD-10-CM | POA: Diagnosis not present

## 2016-04-10 DIAGNOSIS — N183 Chronic kidney disease, stage 3 (moderate): Secondary | ICD-10-CM | POA: Diagnosis not present

## 2016-04-10 DIAGNOSIS — E1122 Type 2 diabetes mellitus with diabetic chronic kidney disease: Secondary | ICD-10-CM | POA: Diagnosis not present

## 2016-04-15 DIAGNOSIS — F1120 Opioid dependence, uncomplicated: Secondary | ICD-10-CM | POA: Diagnosis not present

## 2016-04-22 DIAGNOSIS — Z6841 Body Mass Index (BMI) 40.0 and over, adult: Secondary | ICD-10-CM | POA: Diagnosis not present

## 2016-04-22 DIAGNOSIS — F1120 Opioid dependence, uncomplicated: Secondary | ICD-10-CM | POA: Diagnosis not present

## 2016-04-22 DIAGNOSIS — Z0000 Encounter for general adult medical examination without abnormal findings: Secondary | ICD-10-CM | POA: Diagnosis not present

## 2016-04-23 DIAGNOSIS — F1120 Opioid dependence, uncomplicated: Secondary | ICD-10-CM | POA: Diagnosis not present

## 2016-04-29 DIAGNOSIS — F1120 Opioid dependence, uncomplicated: Secondary | ICD-10-CM | POA: Diagnosis not present

## 2016-04-30 DIAGNOSIS — F1120 Opioid dependence, uncomplicated: Secondary | ICD-10-CM | POA: Diagnosis not present

## 2016-05-08 DIAGNOSIS — F1120 Opioid dependence, uncomplicated: Secondary | ICD-10-CM | POA: Diagnosis not present

## 2016-05-20 DIAGNOSIS — F1120 Opioid dependence, uncomplicated: Secondary | ICD-10-CM | POA: Diagnosis not present

## 2016-05-21 DIAGNOSIS — F1120 Opioid dependence, uncomplicated: Secondary | ICD-10-CM | POA: Diagnosis not present

## 2016-05-22 DIAGNOSIS — F1120 Opioid dependence, uncomplicated: Secondary | ICD-10-CM | POA: Diagnosis not present

## 2016-05-30 DIAGNOSIS — D509 Iron deficiency anemia, unspecified: Secondary | ICD-10-CM | POA: Diagnosis not present

## 2016-05-30 DIAGNOSIS — E118 Type 2 diabetes mellitus with unspecified complications: Secondary | ICD-10-CM | POA: Diagnosis not present

## 2016-05-30 DIAGNOSIS — E785 Hyperlipidemia, unspecified: Secondary | ICD-10-CM | POA: Diagnosis not present

## 2016-05-30 DIAGNOSIS — G4733 Obstructive sleep apnea (adult) (pediatric): Secondary | ICD-10-CM | POA: Diagnosis not present

## 2016-05-30 DIAGNOSIS — F1120 Opioid dependence, uncomplicated: Secondary | ICD-10-CM | POA: Diagnosis not present

## 2016-06-05 DIAGNOSIS — F1120 Opioid dependence, uncomplicated: Secondary | ICD-10-CM | POA: Diagnosis not present

## 2016-06-13 DIAGNOSIS — F1120 Opioid dependence, uncomplicated: Secondary | ICD-10-CM | POA: Diagnosis not present

## 2016-06-18 DIAGNOSIS — F1120 Opioid dependence, uncomplicated: Secondary | ICD-10-CM | POA: Diagnosis not present

## 2016-06-25 DIAGNOSIS — E118 Type 2 diabetes mellitus with unspecified complications: Secondary | ICD-10-CM | POA: Diagnosis not present

## 2016-06-25 DIAGNOSIS — I10 Essential (primary) hypertension: Secondary | ICD-10-CM | POA: Diagnosis not present

## 2016-06-25 DIAGNOSIS — M545 Low back pain: Secondary | ICD-10-CM | POA: Diagnosis not present

## 2016-06-26 DIAGNOSIS — F1120 Opioid dependence, uncomplicated: Secondary | ICD-10-CM | POA: Diagnosis not present

## 2016-07-01 DIAGNOSIS — F1120 Opioid dependence, uncomplicated: Secondary | ICD-10-CM | POA: Diagnosis not present

## 2016-07-08 DIAGNOSIS — F1120 Opioid dependence, uncomplicated: Secondary | ICD-10-CM | POA: Diagnosis not present

## 2016-07-09 DIAGNOSIS — F1120 Opioid dependence, uncomplicated: Secondary | ICD-10-CM | POA: Diagnosis not present

## 2016-07-15 DIAGNOSIS — F1120 Opioid dependence, uncomplicated: Secondary | ICD-10-CM | POA: Diagnosis not present

## 2016-07-22 DIAGNOSIS — F1120 Opioid dependence, uncomplicated: Secondary | ICD-10-CM | POA: Diagnosis not present

## 2016-07-23 DIAGNOSIS — F1120 Opioid dependence, uncomplicated: Secondary | ICD-10-CM | POA: Diagnosis not present

## 2016-07-24 DIAGNOSIS — F1120 Opioid dependence, uncomplicated: Secondary | ICD-10-CM | POA: Diagnosis not present

## 2016-07-25 ENCOUNTER — Other Ambulatory Visit: Payer: Self-pay | Admitting: Family Medicine

## 2016-07-25 DIAGNOSIS — M545 Low back pain: Secondary | ICD-10-CM

## 2016-07-29 DIAGNOSIS — F1120 Opioid dependence, uncomplicated: Secondary | ICD-10-CM | POA: Diagnosis not present

## 2016-08-01 DIAGNOSIS — F1120 Opioid dependence, uncomplicated: Secondary | ICD-10-CM | POA: Diagnosis not present

## 2016-08-05 DIAGNOSIS — F1120 Opioid dependence, uncomplicated: Secondary | ICD-10-CM | POA: Diagnosis not present

## 2016-08-06 ENCOUNTER — Other Ambulatory Visit: Payer: Commercial Managed Care - HMO

## 2016-08-07 DIAGNOSIS — M4317 Spondylolisthesis, lumbosacral region: Secondary | ICD-10-CM | POA: Diagnosis not present

## 2016-08-07 DIAGNOSIS — M47816 Spondylosis without myelopathy or radiculopathy, lumbar region: Secondary | ICD-10-CM | POA: Diagnosis not present

## 2016-08-07 DIAGNOSIS — M47817 Spondylosis without myelopathy or radiculopathy, lumbosacral region: Secondary | ICD-10-CM | POA: Diagnosis not present

## 2016-08-12 DIAGNOSIS — F1120 Opioid dependence, uncomplicated: Secondary | ICD-10-CM | POA: Diagnosis not present

## 2016-08-19 DIAGNOSIS — M545 Low back pain: Secondary | ICD-10-CM | POA: Diagnosis not present

## 2016-08-19 DIAGNOSIS — F1120 Opioid dependence, uncomplicated: Secondary | ICD-10-CM | POA: Diagnosis not present

## 2016-08-19 DIAGNOSIS — E118 Type 2 diabetes mellitus with unspecified complications: Secondary | ICD-10-CM | POA: Diagnosis not present

## 2016-08-20 DIAGNOSIS — F1120 Opioid dependence, uncomplicated: Secondary | ICD-10-CM | POA: Diagnosis not present

## 2016-08-28 DIAGNOSIS — G4733 Obstructive sleep apnea (adult) (pediatric): Secondary | ICD-10-CM | POA: Diagnosis not present

## 2016-09-02 DIAGNOSIS — F1120 Opioid dependence, uncomplicated: Secondary | ICD-10-CM | POA: Diagnosis not present

## 2016-09-09 DIAGNOSIS — F1120 Opioid dependence, uncomplicated: Secondary | ICD-10-CM | POA: Diagnosis not present

## 2016-09-12 DIAGNOSIS — F1120 Opioid dependence, uncomplicated: Secondary | ICD-10-CM | POA: Diagnosis not present

## 2016-09-16 DIAGNOSIS — F1120 Opioid dependence, uncomplicated: Secondary | ICD-10-CM | POA: Diagnosis not present

## 2016-09-19 DIAGNOSIS — F1120 Opioid dependence, uncomplicated: Secondary | ICD-10-CM | POA: Diagnosis not present

## 2016-09-23 DIAGNOSIS — F1120 Opioid dependence, uncomplicated: Secondary | ICD-10-CM | POA: Diagnosis not present

## 2016-09-27 DIAGNOSIS — F1120 Opioid dependence, uncomplicated: Secondary | ICD-10-CM | POA: Diagnosis not present

## 2016-09-30 DIAGNOSIS — F1120 Opioid dependence, uncomplicated: Secondary | ICD-10-CM | POA: Diagnosis not present

## 2016-10-03 DIAGNOSIS — F1120 Opioid dependence, uncomplicated: Secondary | ICD-10-CM | POA: Diagnosis not present

## 2016-10-04 DIAGNOSIS — E291 Testicular hypofunction: Secondary | ICD-10-CM | POA: Diagnosis not present

## 2016-10-09 DIAGNOSIS — F1120 Opioid dependence, uncomplicated: Secondary | ICD-10-CM | POA: Diagnosis not present

## 2016-10-11 DIAGNOSIS — N5201 Erectile dysfunction due to arterial insufficiency: Secondary | ICD-10-CM | POA: Diagnosis not present

## 2016-10-11 DIAGNOSIS — F1120 Opioid dependence, uncomplicated: Secondary | ICD-10-CM | POA: Diagnosis not present

## 2016-10-11 DIAGNOSIS — E291 Testicular hypofunction: Secondary | ICD-10-CM | POA: Diagnosis not present

## 2016-10-14 DIAGNOSIS — F1120 Opioid dependence, uncomplicated: Secondary | ICD-10-CM | POA: Diagnosis not present

## 2016-10-21 DIAGNOSIS — F1120 Opioid dependence, uncomplicated: Secondary | ICD-10-CM | POA: Diagnosis not present

## 2016-10-28 DIAGNOSIS — F1120 Opioid dependence, uncomplicated: Secondary | ICD-10-CM | POA: Diagnosis not present

## 2016-10-31 DIAGNOSIS — F1120 Opioid dependence, uncomplicated: Secondary | ICD-10-CM | POA: Diagnosis not present

## 2016-11-05 DIAGNOSIS — M545 Low back pain: Secondary | ICD-10-CM | POA: Diagnosis not present

## 2016-11-05 DIAGNOSIS — L730 Acne keloid: Secondary | ICD-10-CM | POA: Diagnosis not present

## 2016-11-07 DIAGNOSIS — F1120 Opioid dependence, uncomplicated: Secondary | ICD-10-CM | POA: Diagnosis not present

## 2016-11-11 DIAGNOSIS — F1120 Opioid dependence, uncomplicated: Secondary | ICD-10-CM | POA: Diagnosis not present

## 2016-11-14 DIAGNOSIS — F1120 Opioid dependence, uncomplicated: Secondary | ICD-10-CM | POA: Diagnosis not present

## 2016-11-18 DIAGNOSIS — F1120 Opioid dependence, uncomplicated: Secondary | ICD-10-CM | POA: Diagnosis not present

## 2016-11-20 DIAGNOSIS — L720 Epidermal cyst: Secondary | ICD-10-CM | POA: Diagnosis not present

## 2016-11-21 DIAGNOSIS — G8929 Other chronic pain: Secondary | ICD-10-CM | POA: Diagnosis not present

## 2016-11-21 DIAGNOSIS — M5442 Lumbago with sciatica, left side: Secondary | ICD-10-CM | POA: Diagnosis not present

## 2016-11-21 DIAGNOSIS — Z5181 Encounter for therapeutic drug level monitoring: Secondary | ICD-10-CM | POA: Diagnosis not present

## 2016-11-21 DIAGNOSIS — M791 Myalgia: Secondary | ICD-10-CM | POA: Diagnosis not present

## 2016-11-21 DIAGNOSIS — M5441 Lumbago with sciatica, right side: Secondary | ICD-10-CM | POA: Diagnosis not present

## 2016-11-21 DIAGNOSIS — M533 Sacrococcygeal disorders, not elsewhere classified: Secondary | ICD-10-CM | POA: Diagnosis not present

## 2016-11-21 DIAGNOSIS — G894 Chronic pain syndrome: Secondary | ICD-10-CM | POA: Diagnosis not present

## 2016-11-21 DIAGNOSIS — F1111 Opioid abuse, in remission: Secondary | ICD-10-CM | POA: Diagnosis not present

## 2016-11-25 DIAGNOSIS — F1120 Opioid dependence, uncomplicated: Secondary | ICD-10-CM | POA: Diagnosis not present

## 2016-12-02 DIAGNOSIS — F1120 Opioid dependence, uncomplicated: Secondary | ICD-10-CM | POA: Diagnosis not present

## 2016-12-05 DIAGNOSIS — F1120 Opioid dependence, uncomplicated: Secondary | ICD-10-CM | POA: Diagnosis not present

## 2016-12-06 DIAGNOSIS — G4733 Obstructive sleep apnea (adult) (pediatric): Secondary | ICD-10-CM | POA: Diagnosis not present

## 2016-12-10 DIAGNOSIS — F1120 Opioid dependence, uncomplicated: Secondary | ICD-10-CM | POA: Diagnosis not present

## 2016-12-11 DIAGNOSIS — F1120 Opioid dependence, uncomplicated: Secondary | ICD-10-CM | POA: Diagnosis not present

## 2016-12-12 DIAGNOSIS — L720 Epidermal cyst: Secondary | ICD-10-CM | POA: Diagnosis not present

## 2016-12-25 DIAGNOSIS — F1120 Opioid dependence, uncomplicated: Secondary | ICD-10-CM | POA: Diagnosis not present

## 2016-12-26 DIAGNOSIS — F1120 Opioid dependence, uncomplicated: Secondary | ICD-10-CM | POA: Diagnosis not present

## 2016-12-27 DIAGNOSIS — F1120 Opioid dependence, uncomplicated: Secondary | ICD-10-CM | POA: Diagnosis not present

## 2016-12-30 DIAGNOSIS — F1120 Opioid dependence, uncomplicated: Secondary | ICD-10-CM | POA: Diagnosis not present

## 2016-12-31 DIAGNOSIS — E119 Type 2 diabetes mellitus without complications: Secondary | ICD-10-CM | POA: Diagnosis not present

## 2016-12-31 DIAGNOSIS — I10 Essential (primary) hypertension: Secondary | ICD-10-CM | POA: Diagnosis not present

## 2016-12-31 DIAGNOSIS — F1120 Opioid dependence, uncomplicated: Secondary | ICD-10-CM | POA: Diagnosis not present

## 2017-01-06 DIAGNOSIS — F1120 Opioid dependence, uncomplicated: Secondary | ICD-10-CM | POA: Diagnosis not present

## 2017-01-27 DIAGNOSIS — F1120 Opioid dependence, uncomplicated: Secondary | ICD-10-CM | POA: Diagnosis not present

## 2017-01-29 DIAGNOSIS — F1120 Opioid dependence, uncomplicated: Secondary | ICD-10-CM | POA: Diagnosis not present

## 2017-02-05 DIAGNOSIS — F1120 Opioid dependence, uncomplicated: Secondary | ICD-10-CM | POA: Diagnosis not present

## 2017-02-10 DIAGNOSIS — F1120 Opioid dependence, uncomplicated: Secondary | ICD-10-CM | POA: Diagnosis not present

## 2017-02-14 DIAGNOSIS — F1120 Opioid dependence, uncomplicated: Secondary | ICD-10-CM | POA: Diagnosis not present

## 2017-02-17 DIAGNOSIS — F1120 Opioid dependence, uncomplicated: Secondary | ICD-10-CM | POA: Diagnosis not present

## 2017-02-18 DIAGNOSIS — M5442 Lumbago with sciatica, left side: Secondary | ICD-10-CM | POA: Diagnosis not present

## 2017-02-18 DIAGNOSIS — M5441 Lumbago with sciatica, right side: Secondary | ICD-10-CM | POA: Diagnosis not present

## 2017-02-18 DIAGNOSIS — M533 Sacrococcygeal disorders, not elsewhere classified: Secondary | ICD-10-CM | POA: Diagnosis not present

## 2017-02-18 DIAGNOSIS — M7918 Myalgia, other site: Secondary | ICD-10-CM | POA: Diagnosis not present

## 2017-02-18 DIAGNOSIS — F1111 Opioid abuse, in remission: Secondary | ICD-10-CM | POA: Diagnosis not present

## 2017-02-18 DIAGNOSIS — G8929 Other chronic pain: Secondary | ICD-10-CM | POA: Diagnosis not present

## 2017-02-18 DIAGNOSIS — G894 Chronic pain syndrome: Secondary | ICD-10-CM | POA: Diagnosis not present

## 2017-02-19 DIAGNOSIS — F1120 Opioid dependence, uncomplicated: Secondary | ICD-10-CM | POA: Diagnosis not present

## 2017-02-20 DIAGNOSIS — F1120 Opioid dependence, uncomplicated: Secondary | ICD-10-CM | POA: Diagnosis not present

## 2017-02-27 DIAGNOSIS — F1120 Opioid dependence, uncomplicated: Secondary | ICD-10-CM | POA: Diagnosis not present

## 2017-03-05 DIAGNOSIS — F1120 Opioid dependence, uncomplicated: Secondary | ICD-10-CM | POA: Diagnosis not present

## 2017-03-06 DIAGNOSIS — F1120 Opioid dependence, uncomplicated: Secondary | ICD-10-CM | POA: Diagnosis not present

## 2017-03-07 DIAGNOSIS — F1120 Opioid dependence, uncomplicated: Secondary | ICD-10-CM | POA: Diagnosis not present

## 2017-03-10 DIAGNOSIS — F1120 Opioid dependence, uncomplicated: Secondary | ICD-10-CM | POA: Diagnosis not present

## 2017-03-11 DIAGNOSIS — G4733 Obstructive sleep apnea (adult) (pediatric): Secondary | ICD-10-CM | POA: Diagnosis not present

## 2017-03-17 DIAGNOSIS — F1120 Opioid dependence, uncomplicated: Secondary | ICD-10-CM | POA: Diagnosis not present

## 2017-03-20 DIAGNOSIS — F1120 Opioid dependence, uncomplicated: Secondary | ICD-10-CM | POA: Diagnosis not present

## 2017-03-27 DIAGNOSIS — F1120 Opioid dependence, uncomplicated: Secondary | ICD-10-CM | POA: Diagnosis not present

## 2017-03-31 DIAGNOSIS — F1120 Opioid dependence, uncomplicated: Secondary | ICD-10-CM | POA: Diagnosis not present

## 2017-04-07 DIAGNOSIS — F1120 Opioid dependence, uncomplicated: Secondary | ICD-10-CM | POA: Diagnosis not present

## 2017-04-10 DIAGNOSIS — F1120 Opioid dependence, uncomplicated: Secondary | ICD-10-CM | POA: Diagnosis not present

## 2017-04-14 DIAGNOSIS — F1120 Opioid dependence, uncomplicated: Secondary | ICD-10-CM | POA: Diagnosis not present

## 2017-04-15 DIAGNOSIS — M533 Sacrococcygeal disorders, not elsewhere classified: Secondary | ICD-10-CM | POA: Diagnosis not present

## 2017-04-15 DIAGNOSIS — G894 Chronic pain syndrome: Secondary | ICD-10-CM | POA: Diagnosis not present

## 2017-04-15 DIAGNOSIS — F1111 Opioid abuse, in remission: Secondary | ICD-10-CM | POA: Diagnosis not present

## 2017-04-15 DIAGNOSIS — M7918 Myalgia, other site: Secondary | ICD-10-CM | POA: Diagnosis not present

## 2017-04-15 DIAGNOSIS — G8929 Other chronic pain: Secondary | ICD-10-CM | POA: Diagnosis not present

## 2017-04-15 DIAGNOSIS — M5442 Lumbago with sciatica, left side: Secondary | ICD-10-CM | POA: Diagnosis not present

## 2017-04-15 DIAGNOSIS — M5441 Lumbago with sciatica, right side: Secondary | ICD-10-CM | POA: Diagnosis not present

## 2017-04-24 DIAGNOSIS — F1120 Opioid dependence, uncomplicated: Secondary | ICD-10-CM | POA: Diagnosis not present

## 2017-04-28 DIAGNOSIS — F1120 Opioid dependence, uncomplicated: Secondary | ICD-10-CM | POA: Diagnosis not present

## 2017-04-29 DIAGNOSIS — F1111 Opioid abuse, in remission: Secondary | ICD-10-CM | POA: Diagnosis not present

## 2017-04-29 DIAGNOSIS — F1120 Opioid dependence, uncomplicated: Secondary | ICD-10-CM | POA: Diagnosis not present

## 2017-04-29 DIAGNOSIS — M5442 Lumbago with sciatica, left side: Secondary | ICD-10-CM | POA: Diagnosis not present

## 2017-04-29 DIAGNOSIS — M5441 Lumbago with sciatica, right side: Secondary | ICD-10-CM | POA: Diagnosis not present

## 2017-04-29 DIAGNOSIS — M533 Sacrococcygeal disorders, not elsewhere classified: Secondary | ICD-10-CM | POA: Diagnosis not present

## 2017-04-29 DIAGNOSIS — G894 Chronic pain syndrome: Secondary | ICD-10-CM | POA: Diagnosis not present

## 2017-04-29 DIAGNOSIS — G8929 Other chronic pain: Secondary | ICD-10-CM | POA: Diagnosis not present

## 2017-04-29 DIAGNOSIS — M7918 Myalgia, other site: Secondary | ICD-10-CM | POA: Diagnosis not present

## 2017-05-08 DIAGNOSIS — I10 Essential (primary) hypertension: Secondary | ICD-10-CM | POA: Diagnosis not present

## 2017-05-08 DIAGNOSIS — M159 Polyosteoarthritis, unspecified: Secondary | ICD-10-CM | POA: Diagnosis not present

## 2017-05-08 DIAGNOSIS — R7303 Prediabetes: Secondary | ICD-10-CM | POA: Diagnosis not present

## 2017-05-08 DIAGNOSIS — M545 Low back pain: Secondary | ICD-10-CM | POA: Diagnosis not present

## 2017-05-12 DIAGNOSIS — F1120 Opioid dependence, uncomplicated: Secondary | ICD-10-CM | POA: Diagnosis not present

## 2017-05-12 DIAGNOSIS — Z125 Encounter for screening for malignant neoplasm of prostate: Secondary | ICD-10-CM | POA: Diagnosis not present

## 2017-05-12 DIAGNOSIS — E291 Testicular hypofunction: Secondary | ICD-10-CM | POA: Diagnosis not present

## 2017-05-12 DIAGNOSIS — N5201 Erectile dysfunction due to arterial insufficiency: Secondary | ICD-10-CM | POA: Diagnosis not present

## 2017-05-14 DIAGNOSIS — F1120 Opioid dependence, uncomplicated: Secondary | ICD-10-CM | POA: Diagnosis not present

## 2017-05-16 DIAGNOSIS — F1120 Opioid dependence, uncomplicated: Secondary | ICD-10-CM | POA: Diagnosis not present

## 2017-05-20 DIAGNOSIS — N5201 Erectile dysfunction due to arterial insufficiency: Secondary | ICD-10-CM | POA: Diagnosis not present

## 2017-05-26 DIAGNOSIS — F1120 Opioid dependence, uncomplicated: Secondary | ICD-10-CM | POA: Diagnosis not present

## 2017-05-27 DIAGNOSIS — M533 Sacrococcygeal disorders, not elsewhere classified: Secondary | ICD-10-CM | POA: Diagnosis not present

## 2017-05-27 DIAGNOSIS — M5441 Lumbago with sciatica, right side: Secondary | ICD-10-CM | POA: Diagnosis not present

## 2017-05-27 DIAGNOSIS — G8929 Other chronic pain: Secondary | ICD-10-CM | POA: Diagnosis not present

## 2017-05-27 DIAGNOSIS — M7918 Myalgia, other site: Secondary | ICD-10-CM | POA: Diagnosis not present

## 2017-05-27 DIAGNOSIS — F1111 Opioid abuse, in remission: Secondary | ICD-10-CM | POA: Diagnosis not present

## 2017-05-27 DIAGNOSIS — G894 Chronic pain syndrome: Secondary | ICD-10-CM | POA: Diagnosis not present

## 2017-05-27 DIAGNOSIS — M5442 Lumbago with sciatica, left side: Secondary | ICD-10-CM | POA: Diagnosis not present

## 2017-06-05 DIAGNOSIS — F1120 Opioid dependence, uncomplicated: Secondary | ICD-10-CM | POA: Diagnosis not present

## 2017-06-09 DIAGNOSIS — F1120 Opioid dependence, uncomplicated: Secondary | ICD-10-CM | POA: Diagnosis not present

## 2017-06-11 DIAGNOSIS — G4733 Obstructive sleep apnea (adult) (pediatric): Secondary | ICD-10-CM | POA: Diagnosis not present

## 2017-06-18 DIAGNOSIS — F1120 Opioid dependence, uncomplicated: Secondary | ICD-10-CM | POA: Diagnosis not present

## 2017-06-18 DIAGNOSIS — F1111 Opioid abuse, in remission: Secondary | ICD-10-CM | POA: Diagnosis not present

## 2017-06-18 DIAGNOSIS — M533 Sacrococcygeal disorders, not elsewhere classified: Secondary | ICD-10-CM | POA: Diagnosis not present

## 2017-06-18 DIAGNOSIS — G8929 Other chronic pain: Secondary | ICD-10-CM | POA: Diagnosis not present

## 2017-06-18 DIAGNOSIS — M5442 Lumbago with sciatica, left side: Secondary | ICD-10-CM | POA: Diagnosis not present

## 2017-06-18 DIAGNOSIS — M5441 Lumbago with sciatica, right side: Secondary | ICD-10-CM | POA: Diagnosis not present

## 2017-06-18 DIAGNOSIS — G894 Chronic pain syndrome: Secondary | ICD-10-CM | POA: Diagnosis not present

## 2017-06-18 DIAGNOSIS — M7918 Myalgia, other site: Secondary | ICD-10-CM | POA: Diagnosis not present

## 2017-06-19 DIAGNOSIS — F1120 Opioid dependence, uncomplicated: Secondary | ICD-10-CM | POA: Diagnosis not present

## 2017-06-23 DIAGNOSIS — F1120 Opioid dependence, uncomplicated: Secondary | ICD-10-CM | POA: Diagnosis not present

## 2017-07-07 DIAGNOSIS — F1120 Opioid dependence, uncomplicated: Secondary | ICD-10-CM | POA: Diagnosis not present

## 2017-07-10 DIAGNOSIS — F1120 Opioid dependence, uncomplicated: Secondary | ICD-10-CM | POA: Diagnosis not present

## 2017-07-22 DIAGNOSIS — M533 Sacrococcygeal disorders, not elsewhere classified: Secondary | ICD-10-CM | POA: Diagnosis not present

## 2017-07-22 DIAGNOSIS — F1111 Opioid abuse, in remission: Secondary | ICD-10-CM | POA: Diagnosis not present

## 2017-07-22 DIAGNOSIS — M5441 Lumbago with sciatica, right side: Secondary | ICD-10-CM | POA: Diagnosis not present

## 2017-07-22 DIAGNOSIS — G894 Chronic pain syndrome: Secondary | ICD-10-CM | POA: Diagnosis not present

## 2017-07-22 DIAGNOSIS — M5442 Lumbago with sciatica, left side: Secondary | ICD-10-CM | POA: Diagnosis not present

## 2017-07-22 DIAGNOSIS — M7918 Myalgia, other site: Secondary | ICD-10-CM | POA: Diagnosis not present

## 2017-07-22 DIAGNOSIS — G8929 Other chronic pain: Secondary | ICD-10-CM | POA: Diagnosis not present

## 2017-07-30 DIAGNOSIS — F1120 Opioid dependence, uncomplicated: Secondary | ICD-10-CM | POA: Diagnosis not present

## 2017-07-31 DIAGNOSIS — F1120 Opioid dependence, uncomplicated: Secondary | ICD-10-CM | POA: Diagnosis not present

## 2017-08-04 DIAGNOSIS — F1120 Opioid dependence, uncomplicated: Secondary | ICD-10-CM | POA: Diagnosis not present

## 2017-08-05 DIAGNOSIS — M5442 Lumbago with sciatica, left side: Secondary | ICD-10-CM | POA: Diagnosis not present

## 2017-08-05 DIAGNOSIS — G8929 Other chronic pain: Secondary | ICD-10-CM | POA: Diagnosis not present

## 2017-08-05 DIAGNOSIS — F1111 Opioid abuse, in remission: Secondary | ICD-10-CM | POA: Diagnosis not present

## 2017-08-05 DIAGNOSIS — M5441 Lumbago with sciatica, right side: Secondary | ICD-10-CM | POA: Diagnosis not present

## 2017-08-05 DIAGNOSIS — G894 Chronic pain syndrome: Secondary | ICD-10-CM | POA: Diagnosis not present

## 2017-08-05 DIAGNOSIS — M7918 Myalgia, other site: Secondary | ICD-10-CM | POA: Diagnosis not present

## 2017-08-05 DIAGNOSIS — M533 Sacrococcygeal disorders, not elsewhere classified: Secondary | ICD-10-CM | POA: Diagnosis not present

## 2017-08-11 DIAGNOSIS — F1120 Opioid dependence, uncomplicated: Secondary | ICD-10-CM | POA: Diagnosis not present

## 2017-08-18 DIAGNOSIS — F1120 Opioid dependence, uncomplicated: Secondary | ICD-10-CM | POA: Diagnosis not present

## 2017-08-25 DIAGNOSIS — F1120 Opioid dependence, uncomplicated: Secondary | ICD-10-CM | POA: Diagnosis not present

## 2017-08-26 DIAGNOSIS — F1120 Opioid dependence, uncomplicated: Secondary | ICD-10-CM | POA: Diagnosis not present

## 2017-08-28 DIAGNOSIS — M5442 Lumbago with sciatica, left side: Secondary | ICD-10-CM | POA: Diagnosis not present

## 2017-08-28 DIAGNOSIS — F1111 Opioid abuse, in remission: Secondary | ICD-10-CM | POA: Diagnosis not present

## 2017-08-28 DIAGNOSIS — G8929 Other chronic pain: Secondary | ICD-10-CM | POA: Diagnosis not present

## 2017-08-28 DIAGNOSIS — M5441 Lumbago with sciatica, right side: Secondary | ICD-10-CM | POA: Diagnosis not present

## 2017-08-28 DIAGNOSIS — M7918 Myalgia, other site: Secondary | ICD-10-CM | POA: Diagnosis not present

## 2017-08-28 DIAGNOSIS — G894 Chronic pain syndrome: Secondary | ICD-10-CM | POA: Diagnosis not present

## 2017-08-28 DIAGNOSIS — M533 Sacrococcygeal disorders, not elsewhere classified: Secondary | ICD-10-CM | POA: Diagnosis not present

## 2017-09-01 DIAGNOSIS — F1120 Opioid dependence, uncomplicated: Secondary | ICD-10-CM | POA: Diagnosis not present

## 2017-09-03 DIAGNOSIS — F1120 Opioid dependence, uncomplicated: Secondary | ICD-10-CM | POA: Diagnosis not present

## 2017-09-08 DIAGNOSIS — F1120 Opioid dependence, uncomplicated: Secondary | ICD-10-CM | POA: Diagnosis not present

## 2017-09-11 DIAGNOSIS — G4733 Obstructive sleep apnea (adult) (pediatric): Secondary | ICD-10-CM | POA: Diagnosis not present

## 2017-09-15 DIAGNOSIS — F1120 Opioid dependence, uncomplicated: Secondary | ICD-10-CM | POA: Diagnosis not present

## 2017-09-17 DIAGNOSIS — F1120 Opioid dependence, uncomplicated: Secondary | ICD-10-CM | POA: Diagnosis not present

## 2017-09-22 DIAGNOSIS — F1120 Opioid dependence, uncomplicated: Secondary | ICD-10-CM | POA: Diagnosis not present

## 2017-09-23 DIAGNOSIS — H524 Presbyopia: Secondary | ICD-10-CM | POA: Diagnosis not present

## 2017-09-25 DIAGNOSIS — F1120 Opioid dependence, uncomplicated: Secondary | ICD-10-CM | POA: Diagnosis not present

## 2017-09-29 DIAGNOSIS — F1120 Opioid dependence, uncomplicated: Secondary | ICD-10-CM | POA: Diagnosis not present

## 2017-10-02 DIAGNOSIS — F1120 Opioid dependence, uncomplicated: Secondary | ICD-10-CM | POA: Diagnosis not present

## 2017-10-13 DIAGNOSIS — F1120 Opioid dependence, uncomplicated: Secondary | ICD-10-CM | POA: Diagnosis not present

## 2017-10-20 DIAGNOSIS — F1120 Opioid dependence, uncomplicated: Secondary | ICD-10-CM | POA: Diagnosis not present

## 2017-10-27 DIAGNOSIS — F1120 Opioid dependence, uncomplicated: Secondary | ICD-10-CM | POA: Diagnosis not present

## 2017-10-27 DIAGNOSIS — Z79891 Long term (current) use of opiate analgesic: Secondary | ICD-10-CM | POA: Diagnosis not present

## 2017-10-30 DIAGNOSIS — E291 Testicular hypofunction: Secondary | ICD-10-CM | POA: Diagnosis not present

## 2017-10-30 DIAGNOSIS — F1120 Opioid dependence, uncomplicated: Secondary | ICD-10-CM | POA: Diagnosis not present

## 2017-11-03 DIAGNOSIS — N5201 Erectile dysfunction due to arterial insufficiency: Secondary | ICD-10-CM | POA: Diagnosis not present

## 2017-11-03 DIAGNOSIS — D751 Secondary polycythemia: Secondary | ICD-10-CM | POA: Diagnosis not present

## 2017-11-03 DIAGNOSIS — E291 Testicular hypofunction: Secondary | ICD-10-CM | POA: Diagnosis not present

## 2017-11-03 DIAGNOSIS — F1120 Opioid dependence, uncomplicated: Secondary | ICD-10-CM | POA: Diagnosis not present

## 2017-11-10 DIAGNOSIS — F1120 Opioid dependence, uncomplicated: Secondary | ICD-10-CM | POA: Diagnosis not present

## 2017-11-11 DIAGNOSIS — Z79891 Long term (current) use of opiate analgesic: Secondary | ICD-10-CM | POA: Diagnosis not present

## 2017-11-17 DIAGNOSIS — F1120 Opioid dependence, uncomplicated: Secondary | ICD-10-CM | POA: Diagnosis not present

## 2017-11-24 DIAGNOSIS — Z79891 Long term (current) use of opiate analgesic: Secondary | ICD-10-CM | POA: Diagnosis not present

## 2017-11-24 DIAGNOSIS — F1120 Opioid dependence, uncomplicated: Secondary | ICD-10-CM | POA: Diagnosis not present

## 2017-11-27 DIAGNOSIS — F1120 Opioid dependence, uncomplicated: Secondary | ICD-10-CM | POA: Diagnosis not present

## 2017-12-01 DIAGNOSIS — Z79891 Long term (current) use of opiate analgesic: Secondary | ICD-10-CM | POA: Diagnosis not present

## 2017-12-01 DIAGNOSIS — F1120 Opioid dependence, uncomplicated: Secondary | ICD-10-CM | POA: Diagnosis not present

## 2017-12-08 DIAGNOSIS — F1120 Opioid dependence, uncomplicated: Secondary | ICD-10-CM | POA: Diagnosis not present

## 2017-12-14 DIAGNOSIS — G4733 Obstructive sleep apnea (adult) (pediatric): Secondary | ICD-10-CM | POA: Diagnosis not present

## 2017-12-18 DIAGNOSIS — F1120 Opioid dependence, uncomplicated: Secondary | ICD-10-CM | POA: Diagnosis not present

## 2017-12-18 DIAGNOSIS — Z79891 Long term (current) use of opiate analgesic: Secondary | ICD-10-CM | POA: Diagnosis not present

## 2017-12-22 DIAGNOSIS — F1120 Opioid dependence, uncomplicated: Secondary | ICD-10-CM | POA: Diagnosis not present

## 2017-12-29 DIAGNOSIS — F1120 Opioid dependence, uncomplicated: Secondary | ICD-10-CM | POA: Diagnosis not present

## 2018-01-05 DIAGNOSIS — F1120 Opioid dependence, uncomplicated: Secondary | ICD-10-CM | POA: Diagnosis not present

## 2018-01-06 DIAGNOSIS — M7918 Myalgia, other site: Secondary | ICD-10-CM | POA: Diagnosis not present

## 2018-01-06 DIAGNOSIS — M5441 Lumbago with sciatica, right side: Secondary | ICD-10-CM | POA: Diagnosis not present

## 2018-01-06 DIAGNOSIS — M5442 Lumbago with sciatica, left side: Secondary | ICD-10-CM | POA: Diagnosis not present

## 2018-01-06 DIAGNOSIS — G8929 Other chronic pain: Secondary | ICD-10-CM | POA: Diagnosis not present

## 2018-01-06 DIAGNOSIS — G894 Chronic pain syndrome: Secondary | ICD-10-CM | POA: Diagnosis not present

## 2018-01-06 DIAGNOSIS — F1111 Opioid abuse, in remission: Secondary | ICD-10-CM | POA: Diagnosis not present

## 2018-01-06 DIAGNOSIS — M533 Sacrococcygeal disorders, not elsewhere classified: Secondary | ICD-10-CM | POA: Diagnosis not present

## 2018-01-22 DIAGNOSIS — F1120 Opioid dependence, uncomplicated: Secondary | ICD-10-CM | POA: Diagnosis not present

## 2018-01-23 DIAGNOSIS — Z79891 Long term (current) use of opiate analgesic: Secondary | ICD-10-CM | POA: Diagnosis not present

## 2018-01-26 DIAGNOSIS — F1120 Opioid dependence, uncomplicated: Secondary | ICD-10-CM | POA: Diagnosis not present

## 2018-01-27 DIAGNOSIS — G4733 Obstructive sleep apnea (adult) (pediatric): Secondary | ICD-10-CM | POA: Diagnosis not present

## 2018-02-02 DIAGNOSIS — F1120 Opioid dependence, uncomplicated: Secondary | ICD-10-CM | POA: Diagnosis not present

## 2018-02-09 DIAGNOSIS — F1120 Opioid dependence, uncomplicated: Secondary | ICD-10-CM | POA: Diagnosis not present

## 2018-02-16 DIAGNOSIS — F1120 Opioid dependence, uncomplicated: Secondary | ICD-10-CM | POA: Diagnosis not present

## 2018-02-20 DIAGNOSIS — Z79891 Long term (current) use of opiate analgesic: Secondary | ICD-10-CM | POA: Diagnosis not present

## 2018-03-02 DIAGNOSIS — F1120 Opioid dependence, uncomplicated: Secondary | ICD-10-CM | POA: Diagnosis not present

## 2018-03-05 DIAGNOSIS — F1120 Opioid dependence, uncomplicated: Secondary | ICD-10-CM | POA: Diagnosis not present

## 2018-03-09 DIAGNOSIS — F1120 Opioid dependence, uncomplicated: Secondary | ICD-10-CM | POA: Diagnosis not present

## 2018-03-11 DIAGNOSIS — Z79891 Long term (current) use of opiate analgesic: Secondary | ICD-10-CM | POA: Diagnosis not present

## 2018-03-16 DIAGNOSIS — G4733 Obstructive sleep apnea (adult) (pediatric): Secondary | ICD-10-CM | POA: Diagnosis not present

## 2018-03-20 DIAGNOSIS — F1120 Opioid dependence, uncomplicated: Secondary | ICD-10-CM | POA: Diagnosis not present

## 2018-03-30 DIAGNOSIS — F1120 Opioid dependence, uncomplicated: Secondary | ICD-10-CM | POA: Diagnosis not present

## 2018-04-02 DIAGNOSIS — F1120 Opioid dependence, uncomplicated: Secondary | ICD-10-CM | POA: Diagnosis not present

## 2018-04-06 DIAGNOSIS — F1120 Opioid dependence, uncomplicated: Secondary | ICD-10-CM | POA: Diagnosis not present

## 2018-04-13 DIAGNOSIS — F1120 Opioid dependence, uncomplicated: Secondary | ICD-10-CM | POA: Diagnosis not present

## 2018-04-16 DIAGNOSIS — F1120 Opioid dependence, uncomplicated: Secondary | ICD-10-CM | POA: Diagnosis not present

## 2018-04-27 DIAGNOSIS — F1120 Opioid dependence, uncomplicated: Secondary | ICD-10-CM | POA: Diagnosis not present

## 2018-04-29 DIAGNOSIS — E291 Testicular hypofunction: Secondary | ICD-10-CM | POA: Diagnosis not present

## 2018-04-30 DIAGNOSIS — F1120 Opioid dependence, uncomplicated: Secondary | ICD-10-CM | POA: Diagnosis not present

## 2018-05-04 DIAGNOSIS — F1120 Opioid dependence, uncomplicated: Secondary | ICD-10-CM | POA: Diagnosis not present

## 2018-05-05 DIAGNOSIS — Z6841 Body Mass Index (BMI) 40.0 and over, adult: Secondary | ICD-10-CM | POA: Diagnosis not present

## 2018-05-05 DIAGNOSIS — M79671 Pain in right foot: Secondary | ICD-10-CM | POA: Diagnosis not present

## 2018-05-05 DIAGNOSIS — R7303 Prediabetes: Secondary | ICD-10-CM | POA: Diagnosis not present

## 2018-05-08 DIAGNOSIS — N5201 Erectile dysfunction due to arterial insufficiency: Secondary | ICD-10-CM | POA: Diagnosis not present

## 2018-05-08 DIAGNOSIS — E291 Testicular hypofunction: Secondary | ICD-10-CM | POA: Diagnosis not present

## 2018-05-19 DIAGNOSIS — F1120 Opioid dependence, uncomplicated: Secondary | ICD-10-CM | POA: Diagnosis not present

## 2018-05-25 DIAGNOSIS — F1120 Opioid dependence, uncomplicated: Secondary | ICD-10-CM | POA: Diagnosis not present

## 2018-05-28 DIAGNOSIS — F1120 Opioid dependence, uncomplicated: Secondary | ICD-10-CM | POA: Diagnosis not present

## 2018-06-04 DIAGNOSIS — R7303 Prediabetes: Secondary | ICD-10-CM | POA: Diagnosis not present

## 2018-06-04 DIAGNOSIS — M545 Low back pain: Secondary | ICD-10-CM | POA: Diagnosis not present

## 2018-06-04 DIAGNOSIS — E119 Type 2 diabetes mellitus without complications: Secondary | ICD-10-CM | POA: Diagnosis not present

## 2018-06-04 DIAGNOSIS — I10 Essential (primary) hypertension: Secondary | ICD-10-CM | POA: Diagnosis not present

## 2018-06-04 DIAGNOSIS — M79671 Pain in right foot: Secondary | ICD-10-CM | POA: Diagnosis not present

## 2018-06-04 DIAGNOSIS — M159 Polyosteoarthritis, unspecified: Secondary | ICD-10-CM | POA: Diagnosis not present

## 2018-06-04 DIAGNOSIS — Z6841 Body Mass Index (BMI) 40.0 and over, adult: Secondary | ICD-10-CM | POA: Diagnosis not present

## 2018-06-08 DIAGNOSIS — F1120 Opioid dependence, uncomplicated: Secondary | ICD-10-CM | POA: Diagnosis not present

## 2018-06-15 DIAGNOSIS — F1120 Opioid dependence, uncomplicated: Secondary | ICD-10-CM | POA: Diagnosis not present

## 2018-06-17 DIAGNOSIS — G4733 Obstructive sleep apnea (adult) (pediatric): Secondary | ICD-10-CM | POA: Diagnosis not present

## 2018-06-18 DIAGNOSIS — F1120 Opioid dependence, uncomplicated: Secondary | ICD-10-CM | POA: Diagnosis not present

## 2018-06-22 DIAGNOSIS — F1120 Opioid dependence, uncomplicated: Secondary | ICD-10-CM | POA: Diagnosis not present

## 2018-06-29 DIAGNOSIS — F1120 Opioid dependence, uncomplicated: Secondary | ICD-10-CM | POA: Diagnosis not present

## 2018-06-30 DIAGNOSIS — F1120 Opioid dependence, uncomplicated: Secondary | ICD-10-CM | POA: Diagnosis not present

## 2018-07-06 DIAGNOSIS — F1120 Opioid dependence, uncomplicated: Secondary | ICD-10-CM | POA: Diagnosis not present

## 2018-07-13 DIAGNOSIS — F1120 Opioid dependence, uncomplicated: Secondary | ICD-10-CM | POA: Diagnosis not present

## 2018-07-15 DIAGNOSIS — F1120 Opioid dependence, uncomplicated: Secondary | ICD-10-CM | POA: Diagnosis not present

## 2018-07-20 DIAGNOSIS — F1120 Opioid dependence, uncomplicated: Secondary | ICD-10-CM | POA: Diagnosis not present

## 2018-08-17 DIAGNOSIS — F1120 Opioid dependence, uncomplicated: Secondary | ICD-10-CM | POA: Diagnosis not present

## 2018-09-01 DIAGNOSIS — G4733 Obstructive sleep apnea (adult) (pediatric): Secondary | ICD-10-CM | POA: Diagnosis not present

## 2018-09-01 DIAGNOSIS — F1120 Opioid dependence, uncomplicated: Secondary | ICD-10-CM | POA: Diagnosis not present

## 2018-09-08 DIAGNOSIS — F1120 Opioid dependence, uncomplicated: Secondary | ICD-10-CM | POA: Diagnosis not present

## 2018-09-14 DIAGNOSIS — F1120 Opioid dependence, uncomplicated: Secondary | ICD-10-CM | POA: Diagnosis not present

## 2018-09-15 DIAGNOSIS — G4733 Obstructive sleep apnea (adult) (pediatric): Secondary | ICD-10-CM | POA: Diagnosis not present

## 2018-09-16 DIAGNOSIS — F1120 Opioid dependence, uncomplicated: Secondary | ICD-10-CM | POA: Diagnosis not present

## 2018-09-28 DIAGNOSIS — F1120 Opioid dependence, uncomplicated: Secondary | ICD-10-CM | POA: Diagnosis not present

## 2018-09-30 DIAGNOSIS — F1120 Opioid dependence, uncomplicated: Secondary | ICD-10-CM | POA: Diagnosis not present

## 2018-10-01 DIAGNOSIS — F1120 Opioid dependence, uncomplicated: Secondary | ICD-10-CM | POA: Diagnosis not present

## 2018-10-12 DIAGNOSIS — F1120 Opioid dependence, uncomplicated: Secondary | ICD-10-CM | POA: Diagnosis not present

## 2018-10-20 DIAGNOSIS — F1120 Opioid dependence, uncomplicated: Secondary | ICD-10-CM | POA: Diagnosis not present

## 2018-11-02 DIAGNOSIS — F1120 Opioid dependence, uncomplicated: Secondary | ICD-10-CM | POA: Diagnosis not present

## 2018-11-03 DIAGNOSIS — F1120 Opioid dependence, uncomplicated: Secondary | ICD-10-CM | POA: Diagnosis not present

## 2018-11-10 DIAGNOSIS — F1120 Opioid dependence, uncomplicated: Secondary | ICD-10-CM | POA: Diagnosis not present

## 2018-11-17 DIAGNOSIS — F1120 Opioid dependence, uncomplicated: Secondary | ICD-10-CM | POA: Diagnosis not present

## 2018-11-24 DIAGNOSIS — F1120 Opioid dependence, uncomplicated: Secondary | ICD-10-CM | POA: Diagnosis not present

## 2018-11-30 DIAGNOSIS — R948 Abnormal results of function studies of other organs and systems: Secondary | ICD-10-CM | POA: Diagnosis not present

## 2018-11-30 DIAGNOSIS — E291 Testicular hypofunction: Secondary | ICD-10-CM | POA: Diagnosis not present

## 2018-12-07 DIAGNOSIS — E291 Testicular hypofunction: Secondary | ICD-10-CM | POA: Diagnosis not present

## 2018-12-07 DIAGNOSIS — D751 Secondary polycythemia: Secondary | ICD-10-CM | POA: Diagnosis not present

## 2018-12-07 DIAGNOSIS — N5201 Erectile dysfunction due to arterial insufficiency: Secondary | ICD-10-CM | POA: Diagnosis not present

## 2018-12-08 DIAGNOSIS — R7303 Prediabetes: Secondary | ICD-10-CM | POA: Diagnosis not present

## 2018-12-08 DIAGNOSIS — I10 Essential (primary) hypertension: Secondary | ICD-10-CM | POA: Diagnosis not present

## 2018-12-08 DIAGNOSIS — M79674 Pain in right toe(s): Secondary | ICD-10-CM | POA: Diagnosis not present

## 2018-12-08 DIAGNOSIS — F1120 Opioid dependence, uncomplicated: Secondary | ICD-10-CM | POA: Diagnosis not present

## 2018-12-15 DIAGNOSIS — F1120 Opioid dependence, uncomplicated: Secondary | ICD-10-CM | POA: Diagnosis not present

## 2018-12-16 DIAGNOSIS — G4733 Obstructive sleep apnea (adult) (pediatric): Secondary | ICD-10-CM | POA: Diagnosis not present

## 2018-12-19 DIAGNOSIS — E119 Type 2 diabetes mellitus without complications: Secondary | ICD-10-CM | POA: Diagnosis not present

## 2018-12-19 DIAGNOSIS — H524 Presbyopia: Secondary | ICD-10-CM | POA: Diagnosis not present

## 2018-12-22 DIAGNOSIS — Z0000 Encounter for general adult medical examination without abnormal findings: Secondary | ICD-10-CM | POA: Diagnosis not present

## 2018-12-22 DIAGNOSIS — M79674 Pain in right toe(s): Secondary | ICD-10-CM | POA: Diagnosis not present

## 2018-12-22 DIAGNOSIS — F1120 Opioid dependence, uncomplicated: Secondary | ICD-10-CM | POA: Diagnosis not present

## 2018-12-24 DIAGNOSIS — M79674 Pain in right toe(s): Secondary | ICD-10-CM | POA: Diagnosis not present

## 2018-12-24 DIAGNOSIS — M2041 Other hammer toe(s) (acquired), right foot: Secondary | ICD-10-CM | POA: Diagnosis not present

## 2018-12-24 DIAGNOSIS — F1120 Opioid dependence, uncomplicated: Secondary | ICD-10-CM | POA: Diagnosis not present

## 2018-12-24 DIAGNOSIS — L97512 Non-pressure chronic ulcer of other part of right foot with fat layer exposed: Secondary | ICD-10-CM | POA: Diagnosis not present

## 2018-12-24 DIAGNOSIS — B952 Enterococcus as the cause of diseases classified elsewhere: Secondary | ICD-10-CM | POA: Diagnosis not present

## 2018-12-24 DIAGNOSIS — Z1611 Resistance to penicillins: Secondary | ICD-10-CM | POA: Diagnosis not present

## 2018-12-24 DIAGNOSIS — Z1624 Resistance to multiple antibiotics: Secondary | ICD-10-CM | POA: Diagnosis not present

## 2018-12-24 DIAGNOSIS — B957 Other staphylococcus as the cause of diseases classified elsewhere: Secondary | ICD-10-CM | POA: Diagnosis not present

## 2019-01-07 DIAGNOSIS — L97511 Non-pressure chronic ulcer of other part of right foot limited to breakdown of skin: Secondary | ICD-10-CM | POA: Diagnosis not present

## 2019-01-12 DIAGNOSIS — F1120 Opioid dependence, uncomplicated: Secondary | ICD-10-CM | POA: Diagnosis not present

## 2019-01-22 DIAGNOSIS — M71571 Other bursitis, not elsewhere classified, right ankle and foot: Secondary | ICD-10-CM | POA: Diagnosis not present

## 2019-01-22 DIAGNOSIS — M2041 Other hammer toe(s) (acquired), right foot: Secondary | ICD-10-CM | POA: Diagnosis not present

## 2019-01-26 DIAGNOSIS — Z6841 Body Mass Index (BMI) 40.0 and over, adult: Secondary | ICD-10-CM | POA: Diagnosis not present

## 2019-01-26 DIAGNOSIS — F1120 Opioid dependence, uncomplicated: Secondary | ICD-10-CM | POA: Diagnosis not present

## 2019-01-26 DIAGNOSIS — R945 Abnormal results of liver function studies: Secondary | ICD-10-CM | POA: Diagnosis not present

## 2019-02-02 DIAGNOSIS — F1120 Opioid dependence, uncomplicated: Secondary | ICD-10-CM | POA: Diagnosis not present

## 2019-02-10 DIAGNOSIS — F1120 Opioid dependence, uncomplicated: Secondary | ICD-10-CM | POA: Diagnosis not present

## 2019-02-16 DIAGNOSIS — F1120 Opioid dependence, uncomplicated: Secondary | ICD-10-CM | POA: Diagnosis not present

## 2019-03-10 DIAGNOSIS — F1120 Opioid dependence, uncomplicated: Secondary | ICD-10-CM | POA: Diagnosis not present

## 2019-03-16 DIAGNOSIS — F1120 Opioid dependence, uncomplicated: Secondary | ICD-10-CM | POA: Diagnosis not present

## 2019-03-18 DIAGNOSIS — G4733 Obstructive sleep apnea (adult) (pediatric): Secondary | ICD-10-CM | POA: Diagnosis not present

## 2019-03-20 DIAGNOSIS — R945 Abnormal results of liver function studies: Secondary | ICD-10-CM | POA: Diagnosis not present

## 2019-03-20 DIAGNOSIS — R7303 Prediabetes: Secondary | ICD-10-CM | POA: Diagnosis not present

## 2019-03-20 DIAGNOSIS — I10 Essential (primary) hypertension: Secondary | ICD-10-CM | POA: Diagnosis not present

## 2019-03-20 DIAGNOSIS — M79674 Pain in right toe(s): Secondary | ICD-10-CM | POA: Diagnosis not present

## 2019-03-25 ENCOUNTER — Ambulatory Visit (INDEPENDENT_AMBULATORY_CARE_PROVIDER_SITE_OTHER): Payer: Medicare HMO

## 2019-03-25 ENCOUNTER — Encounter: Payer: Self-pay | Admitting: Podiatry

## 2019-03-25 ENCOUNTER — Ambulatory Visit: Payer: Commercial Managed Care - HMO | Admitting: Podiatry

## 2019-03-25 ENCOUNTER — Ambulatory Visit: Payer: Medicare HMO | Admitting: Podiatry

## 2019-03-25 ENCOUNTER — Other Ambulatory Visit: Payer: Self-pay

## 2019-03-25 VITALS — BP 138/85 | HR 78 | Resp 18

## 2019-03-25 DIAGNOSIS — M2041 Other hammer toe(s) (acquired), right foot: Secondary | ICD-10-CM

## 2019-03-25 DIAGNOSIS — M898X7 Other specified disorders of bone, ankle and foot: Secondary | ICD-10-CM

## 2019-03-25 NOTE — Patient Instructions (Signed)
Pre-Operative Instructions  Congratulations, you have decided to take an important step towards improving your quality of life.  You can be assured that the doctors and staff at [facility] will be with you every step of the way.  Here are some important things you should know:  1. Plan to be at the surgery center/hospital at least 1 (one) hour prior to your scheduled time, unless otherwise directed by the surgical center/hospital staff.  You must have a responsible adult accompany you, remain during the surgery and drive you home.  Make sure you have directions to the surgical center/hospital to ensure you arrive on time. 2. If you are having surgery at [facility] or  hospitals, you will need a copy of your medical history and physical form from your family physician within one month prior to the date of surgery. We will give you a form for your primary physician to complete.  3. We make every effort to accommodate the date you request for surgery.  However, there are times where surgery dates or times have to be moved.  We will contact you as soon as possible if a change in schedule is required.   4. No aspirin/ibuprofen for one week before surgery.  If you are on aspirin, any non-steroidal anti-inflammatory medications (Mobic, Aleve, Ibuprofen) should not be taken seven (7) days prior to your surgery.  You make take Tylenol for pain prior to surgery.  5. Medications - If you are taking daily heart and blood pressure medications, seizure, reflux, allergy, asthma, anxiety, pain or diabetes medications, make sure you notify the surgery center/hospital before the day of surgery so they can tell you which medications you should take or avoid the day of surgery. 6. No food or drink after midnight the night before surgery unless directed otherwise by surgical center/hospital staff. 7. No alcoholic beverages 24-hours prior to surgery.  No smoking 24-hours prior or 24-hours after  surgery. 8. Wear loose pants or shorts. They should be loose enough to fit over bandages, boots, and casts. 9. Don't wear slip-on shoes. Sneakers are preferred. 10. Bring your boot with you to the surgery center/hospital.  Also bring crutches or a walker if your physician has prescribed it for you.  If you do not have this equipment, it will be provided for you after surgery. 11. If you have not been contacted by the surgery center/hospital by the day before your surgery, call to confirm the date and time of your surgery. 12. Leave-time from work may vary depending on the type of surgery you have.  Appropriate arrangements should be made prior to surgery with your employer. 13. Prescriptions will be provided immediately following surgery by your doctor.  Fill these as soon as possible after surgery and take the medication as directed. Pain medications will not be refilled on weekends and must be approved by the doctor. 14. Remove nail polish on the operative foot and avoid getting pedicures prior to surgery. 15. Wash the night before surgery.  The night before surgery wash the foot and leg well with water and the antibacterial soap provided. Be sure to pay special attention to beneath the toenails and in between the toes.  Wash for at least three (3) minutes. Rinse thoroughly with water and dry well with a towel.  Perform this wash unless told not to do so by your physician.  Enclosed: 1 Ice pack (please put in freezer the night before surgery)   1 Hibiclens skin cleaner     Pre-op instructions  If you have any questions regarding the instructions, please do not hesitate to call our office.  [place]: [address], [place], [place] [address] -- [phone]  [place]: [address] [place], [place] [address] -- [phone]  [place]: [address]  [place], [place] [address] -- [phone]   Website: [email or web address] 

## 2019-03-29 NOTE — Progress Notes (Signed)
Subjective:   Patient ID: Duane Davis, male   DOB: 58 y.o.   MRN: [id number]   HPI Patient presents stating I should have had surgery on this toe earlier but I was not able to and it is been bothering me a lot I went to another doctor who is going to do surgery but he is sick and I need to get my toe fixed.  Patient does not smoke likes to be active   Review of Systems  All other systems reviewed and are negative.       Objective:  Physical Exam Vitals signs and nursing note reviewed.  Constitutional:      Appearance: He is well-developed.  Pulmonary:     Effort: Pulmonary effort is normal.  Musculoskeletal: Normal range of motion.  Skin:    General: Skin is warm.  Neurological:     Mental Status: He is alert.     Neurovascular status found to be intact muscle strength was adequate range of motion within normal limits.  Patient is noted to have rotated fifth digit right with keratotic lesion had a proximal phalanx and keratotic lesion distal medial aspect digit 5 right which is painful when pressed and make shoe gear difficult.  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  Patient wants surgery and I recommended arthroplasty with distal exostectomy and patient wants surgery understanding risk.  Today I went ahead and I explained procedure risk and surgery that we will do and patient wants to have this done and at this point is scheduled for outpatient procedure.  I gave instructions on procedure allowed him to read consent form going over alternative treatments complications and he is willing to accept the risk of procedure and after extensive review signed consent form and is  scheduled for outpatient surgery.  Patient is encouraged to call with any questions concerns that he may have  X-ray indicates that there is rotation fifth digit right with osteochondral lesion distal medial aspect digit 5 right

## 2019-03-30 DIAGNOSIS — F1120 Opioid dependence, uncomplicated: Secondary | ICD-10-CM | POA: Diagnosis not present

## 2019-03-31 ENCOUNTER — Telehealth: Payer: Self-pay | Admitting: Podiatry

## 2019-03-31 NOTE — Telephone Encounter (Signed)
Left voicemail for Duane Davis with [employer] to call me back about ov note I faxed for authorization for pt's sx scheduled 11/24.

## 2019-03-31 NOTE — Telephone Encounter (Signed)
[employer] called about the authorization I sent for pt's sx scheduled 11/24. Stated there is no conservative treatment dictated and requested I call them back.

## 2019-04-01 DIAGNOSIS — F1120 Opioid dependence, uncomplicated: Secondary | ICD-10-CM | POA: Diagnosis not present

## 2019-04-01 NOTE — Telephone Encounter (Signed)
Left another message for Aldona Bar to call me back in regards to pt's fax to get prior authorization for sx on 11/24. Asked her to call me back directly and/or e-mail me.

## 2019-04-02 ENCOUNTER — Telehealth: Payer: Self-pay | Admitting: Podiatry

## 2019-04-02 NOTE — Telephone Encounter (Signed)
Confirmed pt his surgery will be at [facility]. Pt asked about registering online with them and I told him the information is in the brochure in the bag they gave him along with his scrub brush and ice pack. Also told him if he could not register online, they would call and get the information over the phone.

## 2019-04-02 NOTE — Telephone Encounter (Signed)
I'm calling to confirm the location where my surgery will be performed at on Tuesday, 24 November.

## 2019-04-02 NOTE — Telephone Encounter (Addendum)
DOS: 04/06/2019  SURGICAL PROCEDURES: Hammertoe Repair 5th [phone]) and Exostectomy 5th [phone])  [employer] Effective Date: 05/13/2017 - 05/13/2019  [employer] for [employer] Auth# [id number] from 04/06/2019 - 05/21/2019 for surgical CPT code [phone].  Deductible is $0  Out of Pocket is $3,400 with $259.39 met and $3,140.61 remains to be met.  CoInsurance is 100%.  No prior authorization or referral is required per [facility].   Call reference # [id number].

## 2019-04-05 DIAGNOSIS — R7303 Prediabetes: Secondary | ICD-10-CM | POA: Diagnosis not present

## 2019-04-05 DIAGNOSIS — B1920 Unspecified viral hepatitis C without hepatic coma: Secondary | ICD-10-CM | POA: Diagnosis not present

## 2019-04-05 DIAGNOSIS — Z6841 Body Mass Index (BMI) 40.0 and over, adult: Secondary | ICD-10-CM | POA: Diagnosis not present

## 2019-04-05 DIAGNOSIS — M65311 Trigger thumb, right thumb: Secondary | ICD-10-CM | POA: Diagnosis not present

## 2019-04-06 ENCOUNTER — Encounter: Payer: Self-pay | Admitting: Podiatry

## 2019-04-06 DIAGNOSIS — M25774 Osteophyte, right foot: Secondary | ICD-10-CM

## 2019-04-06 DIAGNOSIS — M898X7 Other specified disorders of bone, ankle and foot: Secondary | ICD-10-CM | POA: Diagnosis not present

## 2019-04-06 DIAGNOSIS — I10 Essential (primary) hypertension: Secondary | ICD-10-CM | POA: Diagnosis not present

## 2019-04-06 DIAGNOSIS — M2041 Other hammer toe(s) (acquired), right foot: Secondary | ICD-10-CM

## 2019-04-07 ENCOUNTER — Telehealth: Payer: Self-pay | Admitting: *Deleted

## 2019-04-07 NOTE — Telephone Encounter (Signed)
Called patient at [phone] (Cell #) to check to see how they were doing from when they had surgery yesterday, April 06, 2019 with Dr. Paulla Dolly.  DOS 04/06/19 Hammertoe Repair with Removal of Bone 5th RT and Exostectomy 5th Toe RT  Patient stated, "I am doing okay. I am getting around slowly. Pain 1-2/10. I am not taking any pain medicine. I am keeping my foot elevated and wearing my boot. I have no N/V, no fever or shortness of breath".  Patient's bandage is dry, clean and intact and there has been no bleeding.  Patient's next appointment:  Wednesday, 04/14/19 2:15 pm with Dr. Paulla Dolly

## 2019-04-13 DIAGNOSIS — F1120 Opioid dependence, uncomplicated: Secondary | ICD-10-CM | POA: Diagnosis not present

## 2019-04-14 ENCOUNTER — Ambulatory Visit (INDEPENDENT_AMBULATORY_CARE_PROVIDER_SITE_OTHER): Payer: Medicare HMO

## 2019-04-14 ENCOUNTER — Encounter: Payer: Self-pay | Admitting: Podiatry

## 2019-04-14 ENCOUNTER — Other Ambulatory Visit: Payer: Self-pay

## 2019-04-14 ENCOUNTER — Ambulatory Visit (INDEPENDENT_AMBULATORY_CARE_PROVIDER_SITE_OTHER): Payer: Self-pay | Admitting: Podiatry

## 2019-04-14 DIAGNOSIS — M2041 Other hammer toe(s) (acquired), right foot: Secondary | ICD-10-CM

## 2019-04-14 NOTE — Progress Notes (Signed)
Subjective:   Patient ID: Duane Davis, male   DOB: 58 y.o.   MRN: [id number]   HPI Patient states doing really well with surgery   ROS      Objective:  Physical Exam  Neurovascular status intact with patient's fifth digit right inner side fifth digit healing well wound edges well coapted stitches in place     Assessment:  Doing well post arthroplasty digit 5 right exostectomy     Plan:  Reviewed condition and reapplied sterile dressing and reappoint 2 weeks suture removal or earlier if needed  X-rays indicate

## 2019-04-15 DIAGNOSIS — M65311 Trigger thumb, right thumb: Secondary | ICD-10-CM | POA: Diagnosis not present

## 2019-04-16 DIAGNOSIS — E291 Testicular hypofunction: Secondary | ICD-10-CM | POA: Diagnosis not present

## 2019-04-22 DIAGNOSIS — E291 Testicular hypofunction: Secondary | ICD-10-CM | POA: Diagnosis not present

## 2019-04-22 DIAGNOSIS — N5201 Erectile dysfunction due to arterial insufficiency: Secondary | ICD-10-CM | POA: Diagnosis not present

## 2019-04-22 DIAGNOSIS — D751 Secondary polycythemia: Secondary | ICD-10-CM | POA: Diagnosis not present

## 2019-04-27 DIAGNOSIS — F1120 Opioid dependence, uncomplicated: Secondary | ICD-10-CM | POA: Diagnosis not present

## 2019-04-28 ENCOUNTER — Ambulatory Visit (INDEPENDENT_AMBULATORY_CARE_PROVIDER_SITE_OTHER): Payer: Medicare HMO

## 2019-04-28 ENCOUNTER — Ambulatory Visit (INDEPENDENT_AMBULATORY_CARE_PROVIDER_SITE_OTHER): Payer: Medicare HMO | Admitting: Podiatry

## 2019-04-28 ENCOUNTER — Other Ambulatory Visit: Payer: Self-pay

## 2019-04-28 ENCOUNTER — Telehealth: Payer: Self-pay | Admitting: Oncology

## 2019-04-28 ENCOUNTER — Encounter: Payer: Self-pay | Admitting: Podiatry

## 2019-04-28 DIAGNOSIS — M2041 Other hammer toe(s) (acquired), right foot: Secondary | ICD-10-CM

## 2019-04-28 NOTE — Telephone Encounter (Signed)
Received a new patient referral from Dr. Lovena Neighbours for polycythemia. Mr. Duane Davis has been cld and scheduled to see Dr. Alen Blew on 12/22 at 11am. Pt has been made aware to arrive 15 minutes early.

## 2019-04-29 NOTE — Progress Notes (Signed)
Subjective:   Patient ID: Duane Davis, male   DOB: 58 y.o.   MRN: [id number]   HPI Patient states overall doing well with minimal discomfort and on wearing a soft shoe and ready to get stitches out   ROS      Objective:  Physical Exam  Neurovascular status intact negative Bevelyn Buckles' sign noted wound edges well coapted fifth digit and medial side fifth toe with good alignment noted no drainage     Assessment:  Doing well post arthroplasty exostectomy right     Plan:  Stitch removal no drainage noted applied dressing and advised on gradual return to normal shoes and will be seen back as needed  X-rays indicate satisfactory resection of bone good alignment the toe noted

## 2019-05-04 ENCOUNTER — Other Ambulatory Visit: Payer: Self-pay

## 2019-05-04 ENCOUNTER — Inpatient Hospital Stay: Payer: Commercial Managed Care - HMO | Attending: Oncology | Admitting: Oncology

## 2019-05-04 ENCOUNTER — Encounter: Payer: Self-pay | Admitting: Oncology

## 2019-05-04 VITALS — BP 125/81 | HR 71 | Temp 98.2°F | Resp 18 | Ht 70.0 in | Wt 270.0 lb

## 2019-05-04 DIAGNOSIS — Z7984 Long term (current) use of oral hypoglycemic drugs: Secondary | ICD-10-CM

## 2019-05-04 DIAGNOSIS — G4730 Sleep apnea, unspecified: Secondary | ICD-10-CM | POA: Insufficient documentation

## 2019-05-04 DIAGNOSIS — I10 Essential (primary) hypertension: Secondary | ICD-10-CM | POA: Insufficient documentation

## 2019-05-04 DIAGNOSIS — E119 Type 2 diabetes mellitus without complications: Secondary | ICD-10-CM | POA: Diagnosis not present

## 2019-05-04 DIAGNOSIS — D751 Secondary polycythemia: Secondary | ICD-10-CM | POA: Diagnosis not present

## 2019-05-04 DIAGNOSIS — Z87891 Personal history of nicotine dependence: Secondary | ICD-10-CM | POA: Insufficient documentation

## 2019-05-04 DIAGNOSIS — Z79899 Other long term (current) drug therapy: Secondary | ICD-10-CM | POA: Diagnosis not present

## 2019-05-04 DIAGNOSIS — Z8249 Family history of ischemic heart disease and other diseases of the circulatory system: Secondary | ICD-10-CM | POA: Insufficient documentation

## 2019-05-04 DIAGNOSIS — E291 Testicular hypofunction: Secondary | ICD-10-CM | POA: Diagnosis not present

## 2019-05-04 NOTE — Progress Notes (Signed)
Reason for the request:    Polycythemia  HPI: I was asked by Dr. Lovena Neighbours to evaluate Duane Davis for the evaluation of polycythemia.  He is a 58 year old man with a history of diabetes, hypertension and obesity.  He has also been diagnosed with sleep apnea and testosterone deficiency.  He has been receiving testosterone injections and subsequently changed to testosterone gel under the management of Dr. Lovena Neighbours.  In December 2020 his testosterone level was back to normal and his hemoglobin was 17.6 with a hematocrit of 52.8.  He has been donating blood to the [facility] previously but has not been able to do so as of hepatitis.  He was able to undergo phlebotomy however in the last few weeks.  Clinically, reports no recent thrombosis or bleeding episodes.  He denies any recent hospitalizations or illnesses.  He denies any chest pain or shortness of breath.  He does report improvement on testosterone supplements including improvement in his libido and energy.   He does not report any headaches, blurry vision, syncope or seizures. Does not report any fevers, chills or sweats.  Does not report any cough, wheezing or hemoptysis.  Does not report any chest pain, palpitation, orthopnea or leg edema.  Does not report any nausea, vomiting or abdominal pain.  Does not report any constipation or diarrhea.  Does not report any skeletal complaints.    Does not report frequency, urgency or hematuria.  Does not report any skin rashes or lesions. Does not report any heat or cold intolerance.  Does not report any lymphadenopathy or petechiae.  Does not report any anxiety or depression.  Remaining review of systems is negative.    Past Medical History:  Diagnosis Date  . Arthritis   . Diabetes mellitus without complication ([place])   . Hypertension   :  Past Surgical History:  Procedure Laterality Date  . FINGER SURGERY     left hand 4th finger and index finger reattached  :   Current Outpatient Medications:  .   hydrochlorothiazide (MICROZIDE) 12.5 MG capsule, Take 12.5 mg by mouth daily., Disp: , Rfl:  .  lisinopril-hydrochlorothiazide (ZESTORETIC) 10-12.5 MG tablet, , Disp: , Rfl:  .  metFORMIN (GLUCOPHAGE) 500 MG tablet, Take by mouth 2 (two) times daily with a meal., Disp: , Rfl:  .  testosterone (ANDROGEL) 50 MG/5GM (1%) GEL, Place 5 g onto the skin daily., Disp: , Rfl:  .  Testosterone 20.25 MG/ACT (1.62%) GEL, , Disp: , Rfl: :  Allergies  Allergen Reactions  . Zolpidem Tartrate     Sleep walking  :  Family History  Problem Relation Age of Onset  . Hypertension Mother   . Hypertension Maternal Grandmother   :  Social History   Socioeconomic History  . Marital status: Widowed    Spouse name: Not on file  . Number of children: 3  . Years of education: Not on file  . Highest education level: Not on file  Occupational History  . Occupation: disabled  Tobacco Use  . Smoking status: Former Smoker    Packs/day: 0.25    Years: 8.00    Pack years: 2.00    Types: Cigarettes    Quit date: 05/13/1998    Years since quitting: 20.9  . Smokeless tobacco: Never Used  Substance and Sexual Activity  . Alcohol use: No    Comment: quit 2000  . Drug use: No  . Sexual activity: Not on file  Other Topics Concern  . Not on file  Social History Narrative  . Not on file   Social Determinants of Health   Financial Resource Strain:   . Difficulty of Paying Living Expenses: Not on file  Food Insecurity:   . Worried About Charity fundraiser in the Last Year: Not on file  . Ran Out of Food in the Last Year: Not on file  Transportation Needs:   . Lack of Transportation (Medical): Not on file  . Lack of Transportation (Non-Medical): Not on file  Physical Activity:   . Days of Exercise per Week: Not on file  . Minutes of Exercise per Session: Not on file  Stress:   . Feeling of Stress : Not on file  Social Connections:   . Frequency of Communication with Friends and Family: Not on file  .  Frequency of Social Gatherings with Friends and Family: Not on file  . Attends Religious Services: Not on file  . Active Member of Clubs or Organizations: Not on file  . Attends Archivist Meetings: Not on file  . Marital Status: Not on file  Intimate Partner Violence:   . Fear of Current or Ex-Partner: Not on file  . Emotionally Abused: Not on file  . Physically Abused: Not on file  . Sexually Abused: Not on file  :  Pertinent items are noted in HPI.  Exam: Blood pressure 125/81, pulse 71, temperature 98.2 F (36.8 C), temperature source Temporal, resp. rate 18, height 5\' 10"  (1.778 m), weight 270 lb (122.5 kg), SpO2 100 %.  ECOG 1 General appearance: alert and cooperative appeared without distress. Head: atraumatic without any abnormalities. Eyes: conjunctivae/corneas clear. PERRL.  Sclera anicteric. Throat: lips, mucosa, and tongue normal; without oral thrush or ulcers. Resp: clear to auscultation bilaterally without rhonchi, wheezes or dullness to percussion. Cardio: regular rate and rhythm, S1, S2 normal, no murmur, click, rub or gallop GI: soft, non-tender; bowel sounds normal; no masses,  no organomegaly Skin: Skin color, texture, turgor normal. No rashes or lesions Lymph nodes: Cervical, supraclavicular, and axillary nodes normal. Neurologic: Grossly normal without any motor, sensory or deep tendon reflexes. Musculoskeletal: No joint deformity or effusion.   Assessment and Plan:    58 year old man with:  1.  Polycythemia noted since 2018 around the time of testosterone replacement therapy.  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  His hemoglobin was as high as 19 back in December 2018, and close to normal range of 17.1 in December 2019.  Additional factors that could be causing have polycythemia in him would include medication possible dehydration.  Polycythemia vera is a primary hematological  condition is considered unlikely given the multitude of causes that can cause secondary polycythemia.  From a management standpoint, I recommended continued phlebotomy or blood donation every 3 to 6 months to keep his hematocrit below 50.  No additional work-up or intervention is needed at this time.  I also recommended adherence to his CPAP machine which could improve his hemoglobin moving forward.  2.  Thrombosis prophylaxis: Low risk given the secondary etiology of his polycythemia.  I have recommended low-dose aspirin if possible.  3.  Follow-up: I am happy to see him in the future as needed.   40  minutes was spent with the patient face-to-face today.  More than 50% of time was spent on reviewing his laboratory data, discussing differential diagnosis and management options for the future..   Thank you for the referral.  A  copy of this consult has been forwarded to the requesting physician.

## 2019-05-05 ENCOUNTER — Telehealth: Payer: Self-pay | Admitting: Oncology

## 2019-05-05 NOTE — Telephone Encounter (Signed)
No los per 12/22. 

## 2019-05-11 DIAGNOSIS — F1120 Opioid dependence, uncomplicated: Secondary | ICD-10-CM | POA: Diagnosis not present

## 2019-05-17 DIAGNOSIS — F1120 Opioid dependence, uncomplicated: Secondary | ICD-10-CM | POA: Diagnosis not present

## 2019-05-18 DIAGNOSIS — F1120 Opioid dependence, uncomplicated: Secondary | ICD-10-CM | POA: Diagnosis not present

## 2019-05-19 DIAGNOSIS — G4733 Obstructive sleep apnea (adult) (pediatric): Secondary | ICD-10-CM | POA: Diagnosis not present

## 2019-06-01 DIAGNOSIS — E291 Testicular hypofunction: Secondary | ICD-10-CM | POA: Diagnosis not present

## 2019-06-08 DIAGNOSIS — F1120 Opioid dependence, uncomplicated: Secondary | ICD-10-CM | POA: Diagnosis not present

## 2019-06-14 DIAGNOSIS — F1120 Opioid dependence, uncomplicated: Secondary | ICD-10-CM | POA: Diagnosis not present

## 2019-06-15 DIAGNOSIS — F1120 Opioid dependence, uncomplicated: Secondary | ICD-10-CM | POA: Diagnosis not present

## 2019-06-18 DIAGNOSIS — G4733 Obstructive sleep apnea (adult) (pediatric): Secondary | ICD-10-CM | POA: Diagnosis not present

## 2019-06-19 DIAGNOSIS — G4733 Obstructive sleep apnea (adult) (pediatric): Secondary | ICD-10-CM | POA: Diagnosis not present

## 2019-06-22 DIAGNOSIS — I10 Essential (primary) hypertension: Secondary | ICD-10-CM | POA: Diagnosis not present

## 2019-06-22 DIAGNOSIS — R7303 Prediabetes: Secondary | ICD-10-CM | POA: Diagnosis not present

## 2019-07-06 DIAGNOSIS — F1120 Opioid dependence, uncomplicated: Secondary | ICD-10-CM | POA: Diagnosis not present

## 2019-07-12 ENCOUNTER — Encounter: Payer: Self-pay | Admitting: Podiatry

## 2019-07-12 ENCOUNTER — Other Ambulatory Visit: Payer: Self-pay

## 2019-07-12 ENCOUNTER — Ambulatory Visit (INDEPENDENT_AMBULATORY_CARE_PROVIDER_SITE_OTHER): Payer: Medicare HMO

## 2019-07-12 ENCOUNTER — Ambulatory Visit: Payer: Medicare HMO | Admitting: Podiatry

## 2019-07-12 VITALS — Temp 97.9°F

## 2019-07-12 DIAGNOSIS — M2041 Other hammer toe(s) (acquired), right foot: Secondary | ICD-10-CM

## 2019-07-12 DIAGNOSIS — L84 Corns and callosities: Secondary | ICD-10-CM | POA: Diagnosis not present

## 2019-07-12 NOTE — Progress Notes (Signed)
Subjective:   Patient ID: Duane Davis, male   DOB: 59 y.o.   MRN: [id number]   HPI Patient presents stating doing well and states that he is concerned about the lesion on the fifth digit right   ROS      Objective:  Physical Exam  Neurovascular status intact with patient having had arthroplasty exostectomy right that did well with a proximal lesion of the fifth digit     Assessment:  Keratotic lesion that may be reactive type tissue formation or related to hammertoe formation      Plan:  Reviewed hammertoe formation and possibility removal of more bone and at this point we will try conservative and I did sterile prep and then I debrided tissue with no iatrogenic bleeding applied sterile dressing and instructed on soaks wider shoes and if symptoms will recur patient will be seen back  X-ray indicates satisfactory resection of bone with no indications of pathology

## 2019-07-13 DIAGNOSIS — F1120 Opioid dependence, uncomplicated: Secondary | ICD-10-CM | POA: Diagnosis not present

## 2019-07-17 DIAGNOSIS — G4733 Obstructive sleep apnea (adult) (pediatric): Secondary | ICD-10-CM | POA: Diagnosis not present

## 2019-08-03 DIAGNOSIS — F1120 Opioid dependence, uncomplicated: Secondary | ICD-10-CM | POA: Diagnosis not present

## 2019-08-17 DIAGNOSIS — G4733 Obstructive sleep apnea (adult) (pediatric): Secondary | ICD-10-CM | POA: Diagnosis not present

## 2019-08-17 DIAGNOSIS — F1120 Opioid dependence, uncomplicated: Secondary | ICD-10-CM | POA: Diagnosis not present

## 2019-08-20 DIAGNOSIS — G4733 Obstructive sleep apnea (adult) (pediatric): Secondary | ICD-10-CM | POA: Diagnosis not present

## 2019-08-31 DIAGNOSIS — F1120 Opioid dependence, uncomplicated: Secondary | ICD-10-CM | POA: Diagnosis not present

## 2019-09-06 DIAGNOSIS — F1120 Opioid dependence, uncomplicated: Secondary | ICD-10-CM | POA: Diagnosis not present

## 2019-09-10 DIAGNOSIS — F1120 Opioid dependence, uncomplicated: Secondary | ICD-10-CM | POA: Diagnosis not present

## 2019-09-13 DIAGNOSIS — F1120 Opioid dependence, uncomplicated: Secondary | ICD-10-CM | POA: Diagnosis not present

## 2019-09-14 DIAGNOSIS — F1120 Opioid dependence, uncomplicated: Secondary | ICD-10-CM | POA: Diagnosis not present

## 2019-09-16 DIAGNOSIS — G4733 Obstructive sleep apnea (adult) (pediatric): Secondary | ICD-10-CM | POA: Diagnosis not present

## 2019-09-28 DIAGNOSIS — F1120 Opioid dependence, uncomplicated: Secondary | ICD-10-CM | POA: Diagnosis not present

## 2019-10-17 DIAGNOSIS — G4733 Obstructive sleep apnea (adult) (pediatric): Secondary | ICD-10-CM | POA: Diagnosis not present

## 2019-10-18 DIAGNOSIS — E291 Testicular hypofunction: Secondary | ICD-10-CM | POA: Diagnosis not present

## 2019-10-19 DIAGNOSIS — F1120 Opioid dependence, uncomplicated: Secondary | ICD-10-CM | POA: Diagnosis not present

## 2019-10-19 DIAGNOSIS — I10 Essential (primary) hypertension: Secondary | ICD-10-CM | POA: Diagnosis not present

## 2019-10-19 DIAGNOSIS — D45 Polycythemia vera: Secondary | ICD-10-CM | POA: Diagnosis not present

## 2019-10-19 DIAGNOSIS — R7303 Prediabetes: Secondary | ICD-10-CM | POA: Diagnosis not present

## 2019-10-19 DIAGNOSIS — F5221 Male erectile disorder: Secondary | ICD-10-CM | POA: Diagnosis not present

## 2019-11-09 DIAGNOSIS — F1120 Opioid dependence, uncomplicated: Secondary | ICD-10-CM | POA: Diagnosis not present

## 2019-11-10 DIAGNOSIS — E119 Type 2 diabetes mellitus without complications: Secondary | ICD-10-CM | POA: Diagnosis not present

## 2019-11-10 DIAGNOSIS — I10 Essential (primary) hypertension: Secondary | ICD-10-CM | POA: Diagnosis not present

## 2019-11-10 DIAGNOSIS — Z7984 Long term (current) use of oral hypoglycemic drugs: Secondary | ICD-10-CM | POA: Diagnosis not present

## 2019-11-16 DIAGNOSIS — F1120 Opioid dependence, uncomplicated: Secondary | ICD-10-CM | POA: Diagnosis not present

## 2019-11-16 DIAGNOSIS — G4733 Obstructive sleep apnea (adult) (pediatric): Secondary | ICD-10-CM | POA: Diagnosis not present

## 2019-11-18 DIAGNOSIS — G4733 Obstructive sleep apnea (adult) (pediatric): Secondary | ICD-10-CM | POA: Diagnosis not present

## 2019-12-06 DIAGNOSIS — E291 Testicular hypofunction: Secondary | ICD-10-CM | POA: Diagnosis not present

## 2019-12-06 DIAGNOSIS — R948 Abnormal results of function studies of other organs and systems: Secondary | ICD-10-CM | POA: Diagnosis not present

## 2019-12-06 DIAGNOSIS — D751 Secondary polycythemia: Secondary | ICD-10-CM | POA: Diagnosis not present

## 2019-12-06 DIAGNOSIS — N5201 Erectile dysfunction due to arterial insufficiency: Secondary | ICD-10-CM | POA: Diagnosis not present

## 2019-12-07 DIAGNOSIS — Z6841 Body Mass Index (BMI) 40.0 and over, adult: Secondary | ICD-10-CM | POA: Diagnosis not present

## 2019-12-07 DIAGNOSIS — E785 Hyperlipidemia, unspecified: Secondary | ICD-10-CM | POA: Diagnosis not present

## 2019-12-07 DIAGNOSIS — F5221 Male erectile disorder: Secondary | ICD-10-CM | POA: Diagnosis not present

## 2019-12-07 DIAGNOSIS — I10 Essential (primary) hypertension: Secondary | ICD-10-CM | POA: Diagnosis not present

## 2019-12-07 DIAGNOSIS — R7303 Prediabetes: Secondary | ICD-10-CM | POA: Diagnosis not present

## 2019-12-09 DIAGNOSIS — F1120 Opioid dependence, uncomplicated: Secondary | ICD-10-CM | POA: Diagnosis not present

## 2019-12-14 DIAGNOSIS — F1120 Opioid dependence, uncomplicated: Secondary | ICD-10-CM | POA: Diagnosis not present

## 2019-12-15 DIAGNOSIS — F1120 Opioid dependence, uncomplicated: Secondary | ICD-10-CM | POA: Diagnosis not present

## 2019-12-17 DIAGNOSIS — G4733 Obstructive sleep apnea (adult) (pediatric): Secondary | ICD-10-CM | POA: Diagnosis not present

## 2019-12-26 DIAGNOSIS — E119 Type 2 diabetes mellitus without complications: Secondary | ICD-10-CM | POA: Diagnosis not present

## 2020-01-04 DIAGNOSIS — F1120 Opioid dependence, uncomplicated: Secondary | ICD-10-CM | POA: Diagnosis not present

## 2020-01-04 DIAGNOSIS — R7303 Prediabetes: Secondary | ICD-10-CM | POA: Diagnosis not present

## 2020-01-04 DIAGNOSIS — Z1159 Encounter for screening for other viral diseases: Secondary | ICD-10-CM | POA: Diagnosis not present

## 2020-01-17 DIAGNOSIS — G4733 Obstructive sleep apnea (adult) (pediatric): Secondary | ICD-10-CM | POA: Diagnosis not present

## 2020-01-18 DIAGNOSIS — F1120 Opioid dependence, uncomplicated: Secondary | ICD-10-CM | POA: Diagnosis not present

## 2020-02-01 DIAGNOSIS — R7303 Prediabetes: Secondary | ICD-10-CM | POA: Diagnosis not present

## 2020-02-01 DIAGNOSIS — Z0000 Encounter for general adult medical examination without abnormal findings: Secondary | ICD-10-CM | POA: Diagnosis not present

## 2020-02-01 DIAGNOSIS — F1120 Opioid dependence, uncomplicated: Secondary | ICD-10-CM | POA: Diagnosis not present

## 2020-02-01 DIAGNOSIS — Z23 Encounter for immunization: Secondary | ICD-10-CM | POA: Diagnosis not present

## 2020-02-15 DIAGNOSIS — F1120 Opioid dependence, uncomplicated: Secondary | ICD-10-CM | POA: Diagnosis not present

## 2020-02-16 DIAGNOSIS — G4733 Obstructive sleep apnea (adult) (pediatric): Secondary | ICD-10-CM | POA: Diagnosis not present

## 2020-02-22 DIAGNOSIS — F1120 Opioid dependence, uncomplicated: Secondary | ICD-10-CM | POA: Diagnosis not present

## 2020-02-29 DIAGNOSIS — F1120 Opioid dependence, uncomplicated: Secondary | ICD-10-CM | POA: Diagnosis not present

## 2020-03-14 DIAGNOSIS — F1120 Opioid dependence, uncomplicated: Secondary | ICD-10-CM | POA: Diagnosis not present

## 2020-03-17 DIAGNOSIS — E119 Type 2 diabetes mellitus without complications: Secondary | ICD-10-CM | POA: Diagnosis not present

## 2020-03-18 DIAGNOSIS — G4733 Obstructive sleep apnea (adult) (pediatric): Secondary | ICD-10-CM | POA: Diagnosis not present

## 2020-03-21 DIAGNOSIS — G4733 Obstructive sleep apnea (adult) (pediatric): Secondary | ICD-10-CM | POA: Diagnosis not present

## 2020-03-21 DIAGNOSIS — F1120 Opioid dependence, uncomplicated: Secondary | ICD-10-CM | POA: Diagnosis not present

## 2020-03-28 DIAGNOSIS — F1120 Opioid dependence, uncomplicated: Secondary | ICD-10-CM | POA: Diagnosis not present

## 2020-04-05 DIAGNOSIS — F1120 Opioid dependence, uncomplicated: Secondary | ICD-10-CM | POA: Diagnosis not present

## 2020-04-11 DIAGNOSIS — F1120 Opioid dependence, uncomplicated: Secondary | ICD-10-CM | POA: Diagnosis not present

## 2020-04-11 DIAGNOSIS — I10 Essential (primary) hypertension: Secondary | ICD-10-CM | POA: Diagnosis not present

## 2020-04-11 DIAGNOSIS — E119 Type 2 diabetes mellitus without complications: Secondary | ICD-10-CM | POA: Diagnosis not present

## 2020-04-11 DIAGNOSIS — Z7984 Long term (current) use of oral hypoglycemic drugs: Secondary | ICD-10-CM | POA: Diagnosis not present

## 2020-04-17 DIAGNOSIS — G4733 Obstructive sleep apnea (adult) (pediatric): Secondary | ICD-10-CM | POA: Diagnosis not present

## 2020-04-20 DIAGNOSIS — F1120 Opioid dependence, uncomplicated: Secondary | ICD-10-CM | POA: Diagnosis not present

## 2020-04-25 DIAGNOSIS — F1120 Opioid dependence, uncomplicated: Secondary | ICD-10-CM | POA: Diagnosis not present

## 2020-05-01 DIAGNOSIS — F1120 Opioid dependence, uncomplicated: Secondary | ICD-10-CM | POA: Diagnosis not present

## 2020-05-02 DIAGNOSIS — I10 Essential (primary) hypertension: Secondary | ICD-10-CM | POA: Diagnosis not present

## 2020-05-02 DIAGNOSIS — E785 Hyperlipidemia, unspecified: Secondary | ICD-10-CM | POA: Diagnosis not present

## 2020-05-02 DIAGNOSIS — R7303 Prediabetes: Secondary | ICD-10-CM | POA: Diagnosis not present

## 2020-05-03 DIAGNOSIS — F1120 Opioid dependence, uncomplicated: Secondary | ICD-10-CM | POA: Diagnosis not present

## 2020-05-09 DIAGNOSIS — F1120 Opioid dependence, uncomplicated: Secondary | ICD-10-CM | POA: Diagnosis not present

## 2020-05-18 DIAGNOSIS — F1120 Opioid dependence, uncomplicated: Secondary | ICD-10-CM | POA: Diagnosis not present

## 2020-05-18 DIAGNOSIS — G4733 Obstructive sleep apnea (adult) (pediatric): Secondary | ICD-10-CM | POA: Diagnosis not present

## 2020-05-19 DIAGNOSIS — G4733 Obstructive sleep apnea (adult) (pediatric): Secondary | ICD-10-CM | POA: Diagnosis not present

## 2020-05-23 DIAGNOSIS — F1120 Opioid dependence, uncomplicated: Secondary | ICD-10-CM | POA: Diagnosis not present

## 2020-05-30 DIAGNOSIS — R948 Abnormal results of function studies of other organs and systems: Secondary | ICD-10-CM | POA: Diagnosis not present

## 2020-05-30 DIAGNOSIS — E291 Testicular hypofunction: Secondary | ICD-10-CM | POA: Diagnosis not present

## 2020-05-31 DIAGNOSIS — F1120 Opioid dependence, uncomplicated: Secondary | ICD-10-CM | POA: Diagnosis not present

## 2020-06-01 DIAGNOSIS — D751 Secondary polycythemia: Secondary | ICD-10-CM | POA: Diagnosis not present

## 2020-06-01 DIAGNOSIS — N5201 Erectile dysfunction due to arterial insufficiency: Secondary | ICD-10-CM | POA: Diagnosis not present

## 2020-06-01 DIAGNOSIS — E291 Testicular hypofunction: Secondary | ICD-10-CM | POA: Diagnosis not present

## 2020-06-02 ENCOUNTER — Other Ambulatory Visit: Payer: Self-pay

## 2020-06-02 ENCOUNTER — Encounter (HOSPITAL_COMMUNITY): Payer: Self-pay

## 2020-06-02 ENCOUNTER — Emergency Department (HOSPITAL_COMMUNITY)
Admission: EM | Admit: 2020-06-02 | Discharge: 2020-06-02 | Disposition: A | Discharge status: Home / Self Care | Payer: Medicare HMO | Attending: Emergency Medicine | Admitting: Emergency Medicine

## 2020-06-02 ENCOUNTER — Emergency Department (HOSPITAL_COMMUNITY): Payer: Medicare HMO

## 2020-06-02 DIAGNOSIS — Y92009 Unspecified place in unspecified non-institutional (private) residence as the place of occurrence of the external cause: Secondary | ICD-10-CM | POA: Diagnosis not present

## 2020-06-02 DIAGNOSIS — W009XXA Unspecified fall due to ice and snow, initial encounter: Secondary | ICD-10-CM | POA: Insufficient documentation

## 2020-06-02 DIAGNOSIS — Z7984 Long term (current) use of oral hypoglycemic drugs: Secondary | ICD-10-CM | POA: Insufficient documentation

## 2020-06-02 DIAGNOSIS — W19XXXA Unspecified fall, initial encounter: Secondary | ICD-10-CM

## 2020-06-02 DIAGNOSIS — E119 Type 2 diabetes mellitus without complications: Secondary | ICD-10-CM | POA: Insufficient documentation

## 2020-06-02 DIAGNOSIS — Z79899 Other long term (current) drug therapy: Secondary | ICD-10-CM | POA: Insufficient documentation

## 2020-06-02 DIAGNOSIS — Z87891 Personal history of nicotine dependence: Secondary | ICD-10-CM | POA: Insufficient documentation

## 2020-06-02 DIAGNOSIS — I10 Essential (primary) hypertension: Secondary | ICD-10-CM | POA: Diagnosis not present

## 2020-06-02 DIAGNOSIS — Y9301 Activity, walking, marching and hiking: Secondary | ICD-10-CM | POA: Diagnosis not present

## 2020-06-02 DIAGNOSIS — M25511 Pain in right shoulder: Secondary | ICD-10-CM | POA: Insufficient documentation

## 2020-06-02 MED ORDER — NAPROXEN 500 MG PO TABS: 500.0000 mg | ORAL_TABLET | Freq: Two times a day (BID) | ORAL | 0 refills | Status: DC

## 2020-06-02 MED ORDER — NAPROXEN 500 MG PO TABS: 500.0000 mg | ORAL_TABLET | Freq: Two times a day (BID) | ORAL | 0 refills | Status: AC

## 2020-06-02 NOTE — ED Triage Notes (Signed)
Patient states he was walking down a ramp at home and slipped on ice. Patient c/o right shoulder pain.

## 2020-06-02 NOTE — Progress Notes (Signed)
Orthopedic Tech Progress Note Patient Details:  Duane Davis [birth year] [id number]  Ortho Devices Ortho Device/Splint Location: applied arm sling to RUE Ortho Device/Splint Interventions: Ordered,Application   Post Interventions Patient Tolerated: Well Instructions Provided: Care of device   Braulio Bosch 06/02/2020, 2:54 PM

## 2020-06-02 NOTE — ED Provider Notes (Signed)
[facility] DEPT Provider Note   CSN: [id number] Arrival date & time: 06/02/20  1334     History Chief Complaint  Patient presents with  . Fall  . Shoulder Pain    Duane Davis is a 60 y.o. male.  HPI   Patient is a 60 year old male with a medical history as noted below.  He presents the emergency department due to a fall.  Patient states that he was donating blood and slipped on ice out front of the building.  He landed on an outstretched right hand and began experiencing exquisite pain in the right shoulder.  Patient states he is unable to move the right upper extremity.  Has not taken anything for his symptoms.  Patient is right-hand dominant.  Denies any right hand or wrist pain.  No head trauma, numbness, tingling.     Past Medical History:  Diagnosis Date  . Arthritis   . Diabetes mellitus without complication ([place])   . Hypertension     Patient Active Problem List   Diagnosis Date Noted  . OSA (obstructive sleep apnea) 10/26/2013    Past Surgical History:  Procedure Laterality Date  . FINGER SURGERY     left hand 4th finger and index finger reattached       Family History  Problem Relation Age of Onset  . Hypertension Mother   . Hypertension Maternal Grandmother     Social History   Tobacco Use  . Smoking status: Former Smoker    Packs/day: 0.25    Years: 8.00    Pack years: 2.00    Types: Cigarettes    Quit date: 05/13/1998    Years since quitting: 22.0  . Smokeless tobacco: Never Used  Vaping Use  . Vaping Use: Never used  Substance Use Topics  . Alcohol use: No    Comment: quit 2000  . Drug use: No    Home Medications Prior to Admission medications   Medication Sig Start Date End Date Taking? Authorizing Provider  hydrochlorothiazide (MICROZIDE) 12.5 MG capsule Take 12.5 mg by mouth daily.    [provider]  lisinopril-hydrochlorothiazide (ZESTORETIC) 10-12.5 MG tablet  04/15/19   [provider]  metFORMIN (GLUCOPHAGE) 500 MG tablet Take by mouth 2 (two) times daily with a meal.    [provider]  naproxen (NAPROSYN) 500 MG tablet Take 1 tablet (500 mg total) by mouth 2 (two) times daily with a meal. 06/02/20   Rayna Sexton, PA-C  testosterone (ANDROGEL) 50 MG/5GM (1%) GEL Place 5 g onto the skin daily.    [provider]  Testosterone 20.25 MG/ACT (1.62%) GEL  04/15/19   [provider]    Allergies    Zolpidem tartrate  Review of Systems   Review of Systems  Musculoskeletal: Positive for arthralgias and myalgias.  Skin: Negative for wound.  Neurological: Negative for syncope, weakness, numbness and headaches.   Physical Exam Updated Vital Signs BP (!) 138/102 (BP Location: Left Arm)   Pulse 66   Temp 98.1 F (36.7 C) (Oral)   Resp 18   Ht 5\' 11"  (1.803 m)   Wt 113.4 kg   SpO2 97%   BMI 34.87 kg/m   Physical Exam Vitals and nursing note reviewed.  Constitutional:      General: He is not in acute distress.    Appearance: Normal appearance. He is not ill-appearing, toxic-appearing or diaphoretic.  HENT:     Head: Normocephalic and atraumatic.     Right  Ear: External ear normal.     Left Ear: External ear normal.     Nose: Nose normal.     Mouth/Throat:     Mouth: Mucous membranes are moist.     Pharynx: Oropharynx is clear. No oropharyngeal exudate or posterior oropharyngeal erythema.  Eyes:     Extraocular Movements: Extraocular movements intact.  Cardiovascular:     Rate and Rhythm: Normal rate.     Pulses: Normal pulses.  Pulmonary:     Effort: Pulmonary effort is normal.  Abdominal:     General: Abdomen is flat.  Musculoskeletal:        General: Tenderness present. No swelling or deformity. Normal range of motion.     Cervical back: Normal range of motion and neck supple. No tenderness.     Comments: Moderate TTP noted along the right shoulder diffusely.  Full passive range of motion of the right shoulder  but passive range of motion does worsen patient's pain.  Unable to assess active range of motion. Pt appears to be unable to move right arm. Notes exquisite pain with any active range of motion.  Distal sensation intact.  Full range of motion of the right elbow as well as right wrist.  Full range of motion of all the fingers of the right hand.  Grip strength intact.  2+ radial pulses.  Skin:    General: Skin is warm and dry.  Neurological:     General: No focal deficit present.     Mental Status: He is alert and oriented to person, place, and time.  Psychiatric:        Mood and Affect: Mood normal.        Behavior: Behavior normal.    ED Results / Procedures / Treatments   Labs (all labs ordered are listed, but only abnormal results are displayed) Labs Reviewed - No data to display  EKG None  Radiology DG Shoulder Right  Result Date: 06/02/2020 CLINICAL DATA:  Right shoulder pain status post fall EXAM: RIGHT SHOULDER - 2+ VIEW COMPARISON:  None FINDINGS: Prominent subacromial spur is noted. Irregularity of the greater tuberosity suspicious for rotator cuff tendinopathy. No acute fracture or dislocation. Visualized portion of the right chest are unremarkable. IMPRESSION: No acute abnormality of the right shoulder Electronically Signed   By: Miachel Roux M.D.   On: 06/02/2020 14:29   Procedures Procedures (including critical care time)  Medications Ordered in ED Medications - No data to display  ED Course  I have reviewed the triage vital signs and the nursing notes.  Pertinent labs & imaging results that were available during my care of the patient were reviewed by me and considered in my medical decision making (see chart for details).  Clinical Course as of 06/02/20 1506  Fri Jun 02, 2020  1442 DG Shoulder Right Prominent subacromial spur is noted. Irregularity of the greater tuberosity suspicious for rotator cuff tendinopathy. No acute fracture or dislocation. Visualized  portion of the right chest are unremarkable. [LJ]    Clinical Course User Index [LJ] Rayna Sexton, PA-C   MDM Rules/Calculators/A&P                          Pt is a 60 y.o. male due to a fall resulting in right shoulder pain.  Imaging: X-ray of the right shoulder showing prominent subacromial spur.  Irregularity of the greater tuberosity is suspicious for rotator cuff tendinopathy.  No acute fracture or dislocation.  I, Rayna Sexton, PA-C, personally reviewed and evaluated these images as part of my medical decision-making.  Patient is neurovascularly intact in the right upper extremity.  Full range of motion of the right elbow, wrist, and all of the fingers of the right hand.  Recommended Tylenol and ibuprofen for patient's pain.  Patient given a right shoulder sling.  Recommended range of motion exercises as his pain tolerates.  Given follow-up with orthopedics.  Return to the emergency department with new or worsening symptoms.  Note: Portions of this report may have been transcribed using voice recognition software. Every effort was made to ensure accuracy; however, inadvertent computerized transcription errors may be present.   Final Clinical Impression(s) / ED Diagnoses Final diagnoses:  Fall, initial encounter  Acute pain of right shoulder    Rx / DC Orders ED Discharge Orders         Ordered    naproxen (NAPROSYN) 500 MG tablet  2 times daily with meals,   Status:  Discontinued        06/02/20 1501    naproxen (NAPROSYN) 500 MG tablet  2 times daily with meals        06/02/20 1502           Rayna Sexton, PA-C 06/02/20 1506    Wyvonnia Dusky, MD 06/02/20 2218

## 2020-06-02 NOTE — Discharge Instructions (Addendum)
I prescribed you an anti-inflammatory called naproxen.  This is the same medication as Aleve.  Please take this as prescribed for your pain throughout the day.  Also, please consider topical pain relieving creams such as Voltaran Gel, BioFreeze, or Icy Hot. There is also a pain relieving cream made by Aleve. You should be able to find all of these at your local pharmacy.   Please keep your right arm in a sling.  Take the sling off periodically throughout the day and perform range of motion exercises on the right shoulder if your pain allows.  You can always return to the emergency department if your pain worsens.  Otherwise, please follow-up with the orthopedic doctor below if your pain persist.  It was a pleasure to meet you.

## 2020-06-06 DIAGNOSIS — F1120 Opioid dependence, uncomplicated: Secondary | ICD-10-CM | POA: Diagnosis not present

## 2020-06-13 DIAGNOSIS — F1120 Opioid dependence, uncomplicated: Secondary | ICD-10-CM | POA: Diagnosis not present

## 2020-06-18 DIAGNOSIS — G4733 Obstructive sleep apnea (adult) (pediatric): Secondary | ICD-10-CM | POA: Diagnosis not present

## 2020-06-20 DIAGNOSIS — F1120 Opioid dependence, uncomplicated: Secondary | ICD-10-CM | POA: Diagnosis not present

## 2020-06-21 DIAGNOSIS — G4733 Obstructive sleep apnea (adult) (pediatric): Secondary | ICD-10-CM | POA: Diagnosis not present

## 2020-06-27 DIAGNOSIS — F1120 Opioid dependence, uncomplicated: Secondary | ICD-10-CM | POA: Diagnosis not present

## 2020-07-04 DIAGNOSIS — F1120 Opioid dependence, uncomplicated: Secondary | ICD-10-CM | POA: Diagnosis not present

## 2020-07-12 DIAGNOSIS — F1120 Opioid dependence, uncomplicated: Secondary | ICD-10-CM | POA: Diagnosis not present

## 2020-07-16 DIAGNOSIS — G4733 Obstructive sleep apnea (adult) (pediatric): Secondary | ICD-10-CM | POA: Diagnosis not present

## 2020-07-18 DIAGNOSIS — E291 Testicular hypofunction: Secondary | ICD-10-CM | POA: Diagnosis not present

## 2020-07-18 DIAGNOSIS — F1120 Opioid dependence, uncomplicated: Secondary | ICD-10-CM | POA: Diagnosis not present

## 2020-07-26 DIAGNOSIS — F1120 Opioid dependence, uncomplicated: Secondary | ICD-10-CM | POA: Diagnosis not present

## 2020-08-01 DIAGNOSIS — F1120 Opioid dependence, uncomplicated: Secondary | ICD-10-CM | POA: Diagnosis not present

## 2020-08-01 DIAGNOSIS — E1169 Type 2 diabetes mellitus with other specified complication: Secondary | ICD-10-CM | POA: Diagnosis not present

## 2020-08-01 DIAGNOSIS — I10 Essential (primary) hypertension: Secondary | ICD-10-CM | POA: Diagnosis not present

## 2020-08-01 DIAGNOSIS — E785 Hyperlipidemia, unspecified: Secondary | ICD-10-CM | POA: Diagnosis not present

## 2020-08-01 DIAGNOSIS — N400 Enlarged prostate without lower urinary tract symptoms: Secondary | ICD-10-CM | POA: Diagnosis not present

## 2020-08-01 DIAGNOSIS — E118 Type 2 diabetes mellitus with unspecified complications: Secondary | ICD-10-CM | POA: Diagnosis not present

## 2020-08-15 DIAGNOSIS — F1120 Opioid dependence, uncomplicated: Secondary | ICD-10-CM | POA: Diagnosis not present

## 2020-08-17 DIAGNOSIS — G4733 Obstructive sleep apnea (adult) (pediatric): Secondary | ICD-10-CM | POA: Diagnosis not present

## 2020-08-23 DIAGNOSIS — F1120 Opioid dependence, uncomplicated: Secondary | ICD-10-CM | POA: Diagnosis not present

## 2020-08-29 DIAGNOSIS — F1120 Opioid dependence, uncomplicated: Secondary | ICD-10-CM | POA: Diagnosis not present

## 2020-09-06 DIAGNOSIS — F1120 Opioid dependence, uncomplicated: Secondary | ICD-10-CM | POA: Diagnosis not present

## 2020-09-09 DIAGNOSIS — E119 Type 2 diabetes mellitus without complications: Secondary | ICD-10-CM | POA: Diagnosis not present

## 2020-09-09 DIAGNOSIS — Z7984 Long term (current) use of oral hypoglycemic drugs: Secondary | ICD-10-CM | POA: Diagnosis not present

## 2020-09-09 DIAGNOSIS — I10 Essential (primary) hypertension: Secondary | ICD-10-CM | POA: Diagnosis not present

## 2020-09-12 DIAGNOSIS — F1120 Opioid dependence, uncomplicated: Secondary | ICD-10-CM | POA: Diagnosis not present

## 2020-09-19 DIAGNOSIS — G4733 Obstructive sleep apnea (adult) (pediatric): Secondary | ICD-10-CM | POA: Diagnosis not present

## 2020-09-19 DIAGNOSIS — F1120 Opioid dependence, uncomplicated: Secondary | ICD-10-CM | POA: Diagnosis not present

## 2020-09-26 DIAGNOSIS — F1120 Opioid dependence, uncomplicated: Secondary | ICD-10-CM | POA: Diagnosis not present

## 2020-09-26 DIAGNOSIS — I10 Essential (primary) hypertension: Secondary | ICD-10-CM | POA: Diagnosis not present

## 2020-09-26 DIAGNOSIS — E785 Hyperlipidemia, unspecified: Secondary | ICD-10-CM | POA: Diagnosis not present

## 2020-09-26 DIAGNOSIS — R7303 Prediabetes: Secondary | ICD-10-CM | POA: Diagnosis not present

## 2020-10-04 DIAGNOSIS — F1120 Opioid dependence, uncomplicated: Secondary | ICD-10-CM | POA: Diagnosis not present

## 2020-10-10 DIAGNOSIS — F1120 Opioid dependence, uncomplicated: Secondary | ICD-10-CM | POA: Diagnosis not present

## 2020-10-18 DIAGNOSIS — F1120 Opioid dependence, uncomplicated: Secondary | ICD-10-CM | POA: Diagnosis not present

## 2020-10-24 DIAGNOSIS — F1120 Opioid dependence, uncomplicated: Secondary | ICD-10-CM | POA: Diagnosis not present

## 2020-10-31 DIAGNOSIS — F1120 Opioid dependence, uncomplicated: Secondary | ICD-10-CM | POA: Diagnosis not present

## 2020-11-01 DIAGNOSIS — F1120 Opioid dependence, uncomplicated: Secondary | ICD-10-CM | POA: Diagnosis not present

## 2020-11-07 DIAGNOSIS — F1120 Opioid dependence, uncomplicated: Secondary | ICD-10-CM | POA: Diagnosis not present

## 2020-11-09 DIAGNOSIS — Z7984 Long term (current) use of oral hypoglycemic drugs: Secondary | ICD-10-CM | POA: Diagnosis not present

## 2020-11-09 DIAGNOSIS — E119 Type 2 diabetes mellitus without complications: Secondary | ICD-10-CM | POA: Diagnosis not present

## 2020-11-09 DIAGNOSIS — I10 Essential (primary) hypertension: Secondary | ICD-10-CM | POA: Diagnosis not present

## 2020-11-14 DIAGNOSIS — F1120 Opioid dependence, uncomplicated: Secondary | ICD-10-CM | POA: Diagnosis not present

## 2020-11-15 DIAGNOSIS — G4733 Obstructive sleep apnea (adult) (pediatric): Secondary | ICD-10-CM | POA: Diagnosis not present

## 2020-11-21 DIAGNOSIS — E291 Testicular hypofunction: Secondary | ICD-10-CM | POA: Diagnosis not present

## 2020-11-21 DIAGNOSIS — F1120 Opioid dependence, uncomplicated: Secondary | ICD-10-CM | POA: Diagnosis not present

## 2020-11-28 DIAGNOSIS — E291 Testicular hypofunction: Secondary | ICD-10-CM | POA: Diagnosis not present

## 2020-11-28 DIAGNOSIS — D751 Secondary polycythemia: Secondary | ICD-10-CM | POA: Diagnosis not present

## 2020-11-28 DIAGNOSIS — F1120 Opioid dependence, uncomplicated: Secondary | ICD-10-CM | POA: Diagnosis not present

## 2020-11-28 DIAGNOSIS — N5201 Erectile dysfunction due to arterial insufficiency: Secondary | ICD-10-CM | POA: Diagnosis not present

## 2020-12-05 DIAGNOSIS — F1120 Opioid dependence, uncomplicated: Secondary | ICD-10-CM | POA: Diagnosis not present

## 2020-12-12 DIAGNOSIS — F1120 Opioid dependence, uncomplicated: Secondary | ICD-10-CM | POA: Diagnosis not present

## 2020-12-19 DIAGNOSIS — F1120 Opioid dependence, uncomplicated: Secondary | ICD-10-CM | POA: Diagnosis not present

## 2020-12-20 DIAGNOSIS — G4733 Obstructive sleep apnea (adult) (pediatric): Secondary | ICD-10-CM | POA: Diagnosis not present

## 2020-12-26 DIAGNOSIS — I10 Essential (primary) hypertension: Secondary | ICD-10-CM | POA: Diagnosis not present

## 2020-12-26 DIAGNOSIS — F1120 Opioid dependence, uncomplicated: Secondary | ICD-10-CM | POA: Diagnosis not present

## 2020-12-26 DIAGNOSIS — E785 Hyperlipidemia, unspecified: Secondary | ICD-10-CM | POA: Diagnosis not present

## 2020-12-26 DIAGNOSIS — R7303 Prediabetes: Secondary | ICD-10-CM | POA: Diagnosis not present

## 2021-01-02 DIAGNOSIS — F1120 Opioid dependence, uncomplicated: Secondary | ICD-10-CM | POA: Diagnosis not present

## 2021-01-08 DIAGNOSIS — E119 Type 2 diabetes mellitus without complications: Secondary | ICD-10-CM | POA: Diagnosis not present

## 2021-01-10 DIAGNOSIS — I10 Essential (primary) hypertension: Secondary | ICD-10-CM | POA: Diagnosis not present

## 2021-01-10 DIAGNOSIS — E119 Type 2 diabetes mellitus without complications: Secondary | ICD-10-CM | POA: Diagnosis not present

## 2021-01-10 DIAGNOSIS — Z7984 Long term (current) use of oral hypoglycemic drugs: Secondary | ICD-10-CM | POA: Diagnosis not present

## 2021-01-11 DIAGNOSIS — F1120 Opioid dependence, uncomplicated: Secondary | ICD-10-CM | POA: Diagnosis not present

## 2021-01-16 DIAGNOSIS — F1120 Opioid dependence, uncomplicated: Secondary | ICD-10-CM | POA: Diagnosis not present

## 2021-01-23 DIAGNOSIS — F1120 Opioid dependence, uncomplicated: Secondary | ICD-10-CM | POA: Diagnosis not present

## 2021-01-24 DIAGNOSIS — F1120 Opioid dependence, uncomplicated: Secondary | ICD-10-CM | POA: Diagnosis not present

## 2021-01-30 DIAGNOSIS — F1120 Opioid dependence, uncomplicated: Secondary | ICD-10-CM | POA: Diagnosis not present

## 2021-02-09 DIAGNOSIS — Z7984 Long term (current) use of oral hypoglycemic drugs: Secondary | ICD-10-CM | POA: Diagnosis not present

## 2021-02-09 DIAGNOSIS — E119 Type 2 diabetes mellitus without complications: Secondary | ICD-10-CM | POA: Diagnosis not present

## 2021-02-09 DIAGNOSIS — I10 Essential (primary) hypertension: Secondary | ICD-10-CM | POA: Diagnosis not present

## 2021-02-13 DIAGNOSIS — G4733 Obstructive sleep apnea (adult) (pediatric): Secondary | ICD-10-CM | POA: Diagnosis not present

## 2021-02-13 DIAGNOSIS — F1120 Opioid dependence, uncomplicated: Secondary | ICD-10-CM | POA: Diagnosis not present

## 2021-02-20 DIAGNOSIS — F1120 Opioid dependence, uncomplicated: Secondary | ICD-10-CM | POA: Diagnosis not present

## 2021-02-27 DIAGNOSIS — F1120 Opioid dependence, uncomplicated: Secondary | ICD-10-CM | POA: Diagnosis not present

## 2021-03-06 DIAGNOSIS — F1120 Opioid dependence, uncomplicated: Secondary | ICD-10-CM | POA: Diagnosis not present

## 2021-03-13 DIAGNOSIS — F1120 Opioid dependence, uncomplicated: Secondary | ICD-10-CM | POA: Diagnosis not present

## 2021-03-13 DIAGNOSIS — Z113 Encounter for screening for infections with a predominantly sexual mode of transmission: Secondary | ICD-10-CM | POA: Diagnosis not present

## 2021-03-20 DIAGNOSIS — F1120 Opioid dependence, uncomplicated: Secondary | ICD-10-CM | POA: Diagnosis not present

## 2021-03-27 DIAGNOSIS — R7303 Prediabetes: Secondary | ICD-10-CM | POA: Diagnosis not present

## 2021-03-27 DIAGNOSIS — D45 Polycythemia vera: Secondary | ICD-10-CM | POA: Diagnosis not present

## 2021-03-27 DIAGNOSIS — F1120 Opioid dependence, uncomplicated: Secondary | ICD-10-CM | POA: Diagnosis not present

## 2021-03-27 DIAGNOSIS — Z6837 Body mass index (BMI) 37.0-37.9, adult: Secondary | ICD-10-CM | POA: Diagnosis not present

## 2021-03-27 DIAGNOSIS — E785 Hyperlipidemia, unspecified: Secondary | ICD-10-CM | POA: Diagnosis not present

## 2021-03-27 DIAGNOSIS — Z23 Encounter for immunization: Secondary | ICD-10-CM | POA: Diagnosis not present

## 2021-03-27 DIAGNOSIS — I10 Essential (primary) hypertension: Secondary | ICD-10-CM | POA: Diagnosis not present

## 2021-04-03 DIAGNOSIS — F1120 Opioid dependence, uncomplicated: Secondary | ICD-10-CM | POA: Diagnosis not present

## 2021-04-10 DIAGNOSIS — F1120 Opioid dependence, uncomplicated: Secondary | ICD-10-CM | POA: Diagnosis not present

## 2021-04-11 DIAGNOSIS — Z7984 Long term (current) use of oral hypoglycemic drugs: Secondary | ICD-10-CM | POA: Diagnosis not present

## 2021-04-11 DIAGNOSIS — E119 Type 2 diabetes mellitus without complications: Secondary | ICD-10-CM | POA: Diagnosis not present

## 2021-04-11 DIAGNOSIS — I10 Essential (primary) hypertension: Secondary | ICD-10-CM | POA: Diagnosis not present

## 2021-04-17 DIAGNOSIS — Z1211 Encounter for screening for malignant neoplasm of colon: Secondary | ICD-10-CM | POA: Diagnosis not present

## 2021-04-17 DIAGNOSIS — F1120 Opioid dependence, uncomplicated: Secondary | ICD-10-CM | POA: Diagnosis not present

## 2021-04-24 DIAGNOSIS — F1120 Opioid dependence, uncomplicated: Secondary | ICD-10-CM | POA: Diagnosis not present

## 2021-04-30 DIAGNOSIS — E785 Hyperlipidemia, unspecified: Secondary | ICD-10-CM | POA: Diagnosis not present

## 2021-04-30 DIAGNOSIS — E118 Type 2 diabetes mellitus with unspecified complications: Secondary | ICD-10-CM | POA: Diagnosis not present

## 2021-05-01 DIAGNOSIS — F1120 Opioid dependence, uncomplicated: Secondary | ICD-10-CM | POA: Diagnosis not present

## 2021-05-08 DIAGNOSIS — F1120 Opioid dependence, uncomplicated: Secondary | ICD-10-CM | POA: Diagnosis not present

## 2021-05-11 DIAGNOSIS — I10 Essential (primary) hypertension: Secondary | ICD-10-CM | POA: Diagnosis not present

## 2021-05-11 DIAGNOSIS — Z7984 Long term (current) use of oral hypoglycemic drugs: Secondary | ICD-10-CM | POA: Diagnosis not present

## 2021-05-11 DIAGNOSIS — E119 Type 2 diabetes mellitus without complications: Secondary | ICD-10-CM | POA: Diagnosis not present

## 2021-05-15 DIAGNOSIS — F1120 Opioid dependence, uncomplicated: Secondary | ICD-10-CM | POA: Diagnosis not present

## 2021-05-22 DIAGNOSIS — F1120 Opioid dependence, uncomplicated: Secondary | ICD-10-CM | POA: Diagnosis not present

## 2021-05-25 DIAGNOSIS — F1120 Opioid dependence, uncomplicated: Secondary | ICD-10-CM | POA: Diagnosis not present

## 2021-05-29 DIAGNOSIS — R7303 Prediabetes: Secondary | ICD-10-CM | POA: Diagnosis not present

## 2021-05-29 DIAGNOSIS — F1120 Opioid dependence, uncomplicated: Secondary | ICD-10-CM | POA: Diagnosis not present

## 2021-05-29 DIAGNOSIS — D45 Polycythemia vera: Secondary | ICD-10-CM | POA: Diagnosis not present

## 2021-05-29 DIAGNOSIS — E785 Hyperlipidemia, unspecified: Secondary | ICD-10-CM | POA: Diagnosis not present

## 2021-05-29 DIAGNOSIS — I10 Essential (primary) hypertension: Secondary | ICD-10-CM | POA: Diagnosis not present

## 2021-06-05 DIAGNOSIS — E291 Testicular hypofunction: Secondary | ICD-10-CM | POA: Diagnosis not present

## 2021-06-05 DIAGNOSIS — R948 Abnormal results of function studies of other organs and systems: Secondary | ICD-10-CM | POA: Diagnosis not present

## 2021-06-05 DIAGNOSIS — F1120 Opioid dependence, uncomplicated: Secondary | ICD-10-CM | POA: Diagnosis not present

## 2021-06-11 DIAGNOSIS — D751 Secondary polycythemia: Secondary | ICD-10-CM | POA: Diagnosis not present

## 2021-06-11 DIAGNOSIS — E291 Testicular hypofunction: Secondary | ICD-10-CM | POA: Diagnosis not present

## 2021-06-12 DIAGNOSIS — F1120 Opioid dependence, uncomplicated: Secondary | ICD-10-CM | POA: Diagnosis not present

## 2021-06-19 DIAGNOSIS — F1120 Opioid dependence, uncomplicated: Secondary | ICD-10-CM | POA: Diagnosis not present

## 2021-06-20 DIAGNOSIS — F1120 Opioid dependence, uncomplicated: Secondary | ICD-10-CM | POA: Diagnosis not present

## 2021-06-27 DIAGNOSIS — F1120 Opioid dependence, uncomplicated: Secondary | ICD-10-CM | POA: Diagnosis not present

## 2021-07-03 DIAGNOSIS — F1120 Opioid dependence, uncomplicated: Secondary | ICD-10-CM | POA: Diagnosis not present

## 2021-07-04 DIAGNOSIS — F1120 Opioid dependence, uncomplicated: Secondary | ICD-10-CM | POA: Diagnosis not present

## 2021-07-05 DIAGNOSIS — L720 Epidermal cyst: Secondary | ICD-10-CM | POA: Diagnosis not present

## 2021-07-10 DIAGNOSIS — F1120 Opioid dependence, uncomplicated: Secondary | ICD-10-CM | POA: Diagnosis not present

## 2021-07-10 DIAGNOSIS — Z7984 Long term (current) use of oral hypoglycemic drugs: Secondary | ICD-10-CM | POA: Diagnosis not present

## 2021-07-10 DIAGNOSIS — I10 Essential (primary) hypertension: Secondary | ICD-10-CM | POA: Diagnosis not present

## 2021-07-10 DIAGNOSIS — E119 Type 2 diabetes mellitus without complications: Secondary | ICD-10-CM | POA: Diagnosis not present

## 2021-07-17 DIAGNOSIS — F1120 Opioid dependence, uncomplicated: Secondary | ICD-10-CM | POA: Diagnosis not present

## 2021-07-31 DIAGNOSIS — F1120 Opioid dependence, uncomplicated: Secondary | ICD-10-CM | POA: Diagnosis not present

## 2021-08-14 DIAGNOSIS — F1120 Opioid dependence, uncomplicated: Secondary | ICD-10-CM | POA: Diagnosis not present

## 2021-08-28 DIAGNOSIS — F1120 Opioid dependence, uncomplicated: Secondary | ICD-10-CM | POA: Diagnosis not present

## 2021-09-07 DIAGNOSIS — F1120 Opioid dependence, uncomplicated: Secondary | ICD-10-CM | POA: Diagnosis not present

## 2021-09-11 DIAGNOSIS — F1120 Opioid dependence, uncomplicated: Secondary | ICD-10-CM | POA: Diagnosis not present

## 2021-09-25 DIAGNOSIS — F1120 Opioid dependence, uncomplicated: Secondary | ICD-10-CM | POA: Diagnosis not present

## 2021-10-09 DIAGNOSIS — F1120 Opioid dependence, uncomplicated: Secondary | ICD-10-CM | POA: Diagnosis not present

## 2021-10-23 DIAGNOSIS — F1120 Opioid dependence, uncomplicated: Secondary | ICD-10-CM | POA: Diagnosis not present

## 2021-10-30 DIAGNOSIS — M545 Low back pain, unspecified: Secondary | ICD-10-CM | POA: Diagnosis not present

## 2021-10-30 DIAGNOSIS — D499 Neoplasm of unspecified behavior of unspecified site: Secondary | ICD-10-CM | POA: Diagnosis not present

## 2021-10-30 DIAGNOSIS — D45 Polycythemia vera: Secondary | ICD-10-CM | POA: Diagnosis not present

## 2021-10-30 DIAGNOSIS — I10 Essential (primary) hypertension: Secondary | ICD-10-CM | POA: Diagnosis not present

## 2021-10-30 DIAGNOSIS — M5010 Cervical disc disorder with radiculopathy, unspecified cervical region: Secondary | ICD-10-CM | POA: Diagnosis not present

## 2021-10-30 DIAGNOSIS — F5221 Male erectile disorder: Secondary | ICD-10-CM | POA: Diagnosis not present

## 2021-10-30 DIAGNOSIS — M79602 Pain in left arm: Secondary | ICD-10-CM | POA: Diagnosis not present

## 2021-10-30 DIAGNOSIS — R7303 Prediabetes: Secondary | ICD-10-CM | POA: Diagnosis not present

## 2021-10-30 DIAGNOSIS — E785 Hyperlipidemia, unspecified: Secondary | ICD-10-CM | POA: Diagnosis not present

## 2021-10-31 ENCOUNTER — Other Ambulatory Visit: Payer: Self-pay | Admitting: Family Medicine

## 2021-11-01 ENCOUNTER — Other Ambulatory Visit: Payer: Self-pay | Admitting: Family Medicine

## 2021-11-06 ENCOUNTER — Emergency Department: Payer: Medicare HMO

## 2021-11-06 ENCOUNTER — Other Ambulatory Visit: Payer: Self-pay

## 2021-11-06 ENCOUNTER — Emergency Department
Admission: EM | Admit: 2021-11-06 | Discharge: 2021-11-06 | Disposition: A | Discharge status: Home / Self Care | Payer: Medicare HMO | Attending: Emergency Medicine | Admitting: Emergency Medicine

## 2021-11-06 DIAGNOSIS — R29818 Other symptoms and signs involving the nervous system: Secondary | ICD-10-CM | POA: Diagnosis not present

## 2021-11-06 DIAGNOSIS — M5412 Radiculopathy, cervical region: Secondary | ICD-10-CM | POA: Insufficient documentation

## 2021-11-06 DIAGNOSIS — F1120 Opioid dependence, uncomplicated: Secondary | ICD-10-CM | POA: Diagnosis not present

## 2021-11-06 DIAGNOSIS — I10 Essential (primary) hypertension: Secondary | ICD-10-CM | POA: Diagnosis not present

## 2021-11-06 DIAGNOSIS — R200 Anesthesia of skin: Secondary | ICD-10-CM | POA: Diagnosis not present

## 2021-11-06 DIAGNOSIS — S199XXA Unspecified injury of neck, initial encounter: Secondary | ICD-10-CM | POA: Diagnosis not present

## 2021-11-06 DIAGNOSIS — R791 Abnormal coagulation profile: Secondary | ICD-10-CM | POA: Diagnosis not present

## 2021-11-06 DIAGNOSIS — M546 Pain in thoracic spine: Secondary | ICD-10-CM | POA: Diagnosis not present

## 2021-11-06 LAB — COMPREHENSIVE METABOLIC PANEL
ALT: 45 U/L — ABNORMAL HIGH (ref 0–44)
AST: 53 U/L — ABNORMAL HIGH (ref 15–41)
Albumin: 4.2 g/dL (ref 3.5–5.0)
Alkaline Phosphatase: 50 U/L (ref 38–126)
Anion gap: 9 (ref 5–15)
BUN: 10 mg/dL (ref 6–20)
CO2: 26 mmol/L (ref 22–32)
Calcium: 8.9 mg/dL (ref 8.9–10.3)
Chloride: 103 mmol/L (ref 98–111)
Creatinine, Ser: 1.16 mg/dL (ref 0.61–1.24)
GFR, Estimated: >60 mL/min (ref >60)
Glucose, Bld: 125 mg/dL — ABNORMAL HIGH (ref 70–99)
Potassium: 3.6 mmol/L (ref 3.5–5.1)
Sodium: 138 mmol/L (ref 135–145)
Total Bilirubin: 0.7 mg/dL (ref 0.3–1.2)
Total Protein: 8.1 g/dL (ref 6.5–8.1)

## 2021-11-06 LAB — APTT: aPTT: 31 seconds (ref 24–36)

## 2021-11-06 LAB — DIFFERENTIAL
Abs Immature Granulocytes: 0 10*3/uL (ref 0.00–0.07)
Basophils Absolute: 0 10*3/uL (ref 0.0–0.1)
Basophils Relative: 1 %
Eosinophils Absolute: 0.3 10*3/uL (ref 0.0–0.5)
Eosinophils Relative: 6 %
Immature Granulocytes: 0 %
Lymphocytes Relative: 38 %
Lymphs Abs: 1.8 10*3/uL (ref 0.7–4.0)
Monocytes Absolute: 0.5 10*3/uL (ref 0.1–1.0)
Monocytes Relative: 11 %
Neutro Abs: 2 10*3/uL (ref 1.7–7.7)
Neutrophils Relative %: 44 %

## 2021-11-06 LAB — CBC
HCT: 42.1 % (ref 39.0–52.0)
Hemoglobin: 13.3 g/dL (ref 13.0–17.0)
MCH: 26 pg (ref 26.0–34.0)
MCHC: 31.6 g/dL (ref 30.0–36.0)
MCV: 82.2 fL (ref 80.0–100.0)
Platelets: 176 10*3/uL (ref 150–400)
RBC: 5.12 MIL/uL (ref 4.22–5.81)
RDW: 16.9 % — ABNORMAL HIGH (ref 11.5–15.5)
WBC: 4.6 10*3/uL (ref 4.0–10.5)
nRBC: 0 % (ref 0.0–0.2)

## 2021-11-06 LAB — PROTIME-INR
INR: 1.1 (ref 0.8–1.2)
Prothrombin Time: 13.9 seconds (ref 11.4–15.2)

## 2021-11-06 MED ORDER — METHYLPREDNISOLONE 4 MG PO TBPK: ORAL_TABLET | ORAL | 0 refills | Status: DC

## 2021-11-06 MED ORDER — SODIUM CHLORIDE 0.9% FLUSH: 3.0000 mL | Freq: Once | INTRAVENOUS | Status: DC

## 2021-11-06 MED ORDER — METHYLPREDNISOLONE 4 MG PO TBPK: ORAL_TABLET | ORAL | 0 refills | Status: AC

## 2021-11-06 NOTE — ED Triage Notes (Signed)
Pt comes with c/o left arm and hand numbness and tingling. Pt states this started over week ago. Pt states he went to PCP and he ordered CT scan but it hasn't been completed yet. MD thinks might be pinched nerve.   Pt states pain when lifting arm. Pt denies any dizziness or blurry vision.

## 2021-11-06 NOTE — ED Provider Notes (Signed)
[facility] Provider Note  Patient Contact: 6:01 PM (approximate)   History   arm numbness   HPI  Duane Davis is a 61 y.o. male presents to the emergency department with left upper extremity pain and paresthesia for the past 2 weeks.  Patient states that he has been trying to coordinate getting a CT scan with his primary care provider but has not been successful.  He states that he has pain primarily along the anterior arm with numbness and tingling of his fourth and fifth digits and has some relief when he rotates his neck to the right.  He denies falls or other mechanisms of trauma.  No fever.  No prior surgeries of the cervical spine.      Physical Exam   Triage Vital Signs: ED Triage Vitals  Enc Vitals Group     BP 11/06/21 1714 (!) 145/80     Pulse Rate 11/06/21 1714 77     Resp 11/06/21 1714 18     Temp 11/06/21 1714 98 F (36.7 C)     Temp src --      SpO2 11/06/21 1714 100 %     Weight --      Height --      Head Circumference --      Peak Flow --      Pain Score 11/06/21 1713 7     Pain Loc --      Pain Edu? --      Excl. in GC? --     Most recent vital signs: Vitals:   11/06/21 1714 11/06/21 1910  BP: (!) 145/80 (!) 142/89  Pulse: 77 69  Resp: 18 16  Temp: 98 F (36.7 C)   SpO2: 100% 95%     General: Alert and in no acute distress. Eyes:  PERRL. EOMI. Head: No acute traumatic findings ENT:      Nose: No congestion/rhinnorhea.      Mouth/Throat: Mucous membranes are moist. Neck: No stridor. No cervical spine tenderness to palpation. Cardiovascular:  Good peripheral perfusion Respiratory: Normal respiratory effort without tachypnea or retractions. Lungs CTAB. Good air entry to the bases with no decreased or absent breath sounds. Gastrointestinal: Bowel sounds 4 quadrants. Soft and nontender to palpation. No guarding or rigidity. No palpable masses. No distention. No CVA tenderness. Musculoskeletal: Patient has symmetric  strength in the upper and lower extremities.  Full range of motion to all extremities.  Neurologic:  Patient has sensation to light touch. Skin:   No rash noted    ED Results / Procedures / Treatments   Labs (all labs ordered are listed, but only abnormal results are displayed) Labs Reviewed  CBC - Abnormal; Notable for the following components:      Result Value   RDW 16.9 (*)    All other components within normal limits  COMPREHENSIVE METABOLIC PANEL - Abnormal; Notable for the following components:   Glucose, Bld 125 (*)    AST 53 (*)    ALT 45 (*)    All other components within normal limits  PROTIME-INR  APTT  DIFFERENTIAL  I-STAT CREATININE, ED  CBG MONITORING, ED      RADIOLOGY  I personally viewed and evaluated these images as part of my medical decision making, as well as reviewing the written report by the radiologist.  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  No acute abnormality of the thoracic spine.  No evidence of intracranial bleed on CT  head.   PROCEDURES:  Critical Care performed: No  Procedures   MEDICATIONS ORDERED IN ED: Medications  sodium chloride flush (NS) 0.9 % injection 3 mL ( Intravenous Canceled Entry 11/06/21 1757)     IMPRESSION / MDM / ASSESSMENT AND PLAN / ED COURSE  I reviewed the triage vital signs and the nursing notes.                              Assessment and plan:  Cervical Radiculopathy:  61 year old male presents to the emergency department with numbness and tingling of the left upper extremity Patient had symmetric grip strength and denies weakness of the left upper extremity on exam.  CT of the cervical spine indicated severe stenosis at C6-C7 but no other acute abnormalities on CTs of the head or thoracic spine.  I did recommend follow-up with outpatient neurosurgery, Dr. Marcell Barlow.  Patient was started on Medrol Dosepak.  Patient states that he has a history of prediabetes and  his sugars have been well controlled at home.  I did recommend monitoring glucose levels daily while taking Medrol Dosepak.  Return precautions were given to return with new or worsening symptoms.  All patient questions were answered.   FINAL CLINICAL IMPRESSION(S) / ED DIAGNOSES   Final diagnoses:  Cervical radiculopathy at C7     Rx / DC Orders   ED Discharge Orders          Ordered    methylPREDNISolone (MEDROL DOSEPAK) 4 MG TBPK tablet  Status:  Discontinued        11/06/21 1902    methylPREDNISolone (MEDROL DOSEPAK) 4 MG TBPK tablet  Status:  Discontinued        11/06/21 1902    methylPREDNISolone (MEDROL DOSEPAK) 4 MG TBPK tablet        11/06/21 1903             Note:  This document was prepared using Dragon voice recognition software and may include unintentional dictation errors.   Pia Mau Valentine, PA-C 11/06/21 1918    Merwyn Katos, MD 11/06/21 [phone]

## 2021-11-06 NOTE — Discharge Instructions (Addendum)
You have been diagnosed with foraminal stenosis at C7. Please contact Dr. Osborne Oman office to be seen as an outpatient. Please take your Medrol Dosepak as directed and monitor your glucose levels while taking medication. Return to the emergency department if symptoms seem to be worsening.

## 2021-11-09 DIAGNOSIS — E119 Type 2 diabetes mellitus without complications: Secondary | ICD-10-CM | POA: Diagnosis not present

## 2021-11-09 DIAGNOSIS — I10 Essential (primary) hypertension: Secondary | ICD-10-CM | POA: Diagnosis not present

## 2021-11-19 DIAGNOSIS — M4802 Spinal stenosis, cervical region: Secondary | ICD-10-CM | POA: Diagnosis not present

## 2021-11-19 DIAGNOSIS — M50123 Cervical disc disorder at C6-C7 level with radiculopathy: Secondary | ICD-10-CM | POA: Diagnosis not present

## 2021-11-20 DIAGNOSIS — F1120 Opioid dependence, uncomplicated: Secondary | ICD-10-CM | POA: Diagnosis not present

## 2021-11-30 DIAGNOSIS — M5030 Other cervical disc degeneration, unspecified cervical region: Secondary | ICD-10-CM | POA: Diagnosis not present

## 2021-11-30 DIAGNOSIS — M5412 Radiculopathy, cervical region: Secondary | ICD-10-CM | POA: Diagnosis not present

## 2021-12-03 ENCOUNTER — Other Ambulatory Visit: Payer: Self-pay | Admitting: Family Medicine

## 2021-12-03 DIAGNOSIS — M5412 Radiculopathy, cervical region: Secondary | ICD-10-CM

## 2021-12-04 DIAGNOSIS — E291 Testicular hypofunction: Secondary | ICD-10-CM | POA: Diagnosis not present

## 2021-12-04 DIAGNOSIS — F1120 Opioid dependence, uncomplicated: Secondary | ICD-10-CM | POA: Diagnosis not present

## 2021-12-07 ENCOUNTER — Ambulatory Visit
Admission: RE | Admit: 2021-12-07 | Discharge: 2021-12-07 | Disposition: A | Discharge status: Home / Self Care | Payer: Medicare HMO | Source: Ambulatory Visit | Attending: Family Medicine | Admitting: Family Medicine

## 2021-12-07 DIAGNOSIS — M542 Cervicalgia: Secondary | ICD-10-CM | POA: Diagnosis not present

## 2021-12-07 DIAGNOSIS — M5412 Radiculopathy, cervical region: Secondary | ICD-10-CM

## 2021-12-07 DIAGNOSIS — R200 Anesthesia of skin: Secondary | ICD-10-CM | POA: Diagnosis not present

## 2021-12-10 DIAGNOSIS — N5201 Erectile dysfunction due to arterial insufficiency: Secondary | ICD-10-CM | POA: Diagnosis not present

## 2021-12-10 DIAGNOSIS — E291 Testicular hypofunction: Secondary | ICD-10-CM | POA: Diagnosis not present

## 2021-12-10 DIAGNOSIS — Z7984 Long term (current) use of oral hypoglycemic drugs: Secondary | ICD-10-CM | POA: Diagnosis not present

## 2021-12-10 DIAGNOSIS — E119 Type 2 diabetes mellitus without complications: Secondary | ICD-10-CM | POA: Diagnosis not present

## 2021-12-10 DIAGNOSIS — D751 Secondary polycythemia: Secondary | ICD-10-CM | POA: Diagnosis not present

## 2021-12-10 DIAGNOSIS — I10 Essential (primary) hypertension: Secondary | ICD-10-CM | POA: Diagnosis not present

## 2021-12-12 DIAGNOSIS — F1120 Opioid dependence, uncomplicated: Secondary | ICD-10-CM | POA: Diagnosis not present

## 2021-12-18 DIAGNOSIS — F1120 Opioid dependence, uncomplicated: Secondary | ICD-10-CM | POA: Diagnosis not present

## 2021-12-21 DIAGNOSIS — F1120 Opioid dependence, uncomplicated: Secondary | ICD-10-CM | POA: Diagnosis not present

## 2021-12-28 DIAGNOSIS — M5412 Radiculopathy, cervical region: Secondary | ICD-10-CM | POA: Diagnosis not present

## 2021-12-28 DIAGNOSIS — M5030 Other cervical disc degeneration, unspecified cervical region: Secondary | ICD-10-CM | POA: Diagnosis not present

## 2022-01-01 DIAGNOSIS — F1120 Opioid dependence, uncomplicated: Secondary | ICD-10-CM | POA: Diagnosis not present

## 2022-01-02 DIAGNOSIS — E119 Type 2 diabetes mellitus without complications: Secondary | ICD-10-CM | POA: Diagnosis not present

## 2022-01-15 DIAGNOSIS — E1169 Type 2 diabetes mellitus with other specified complication: Secondary | ICD-10-CM | POA: Diagnosis not present

## 2022-01-15 DIAGNOSIS — F1120 Opioid dependence, uncomplicated: Secondary | ICD-10-CM | POA: Diagnosis not present

## 2022-01-15 DIAGNOSIS — R7401 Elevation of levels of liver transaminase levels: Secondary | ICD-10-CM | POA: Diagnosis not present

## 2022-01-15 DIAGNOSIS — Z6835 Body mass index (BMI) 35.0-35.9, adult: Secondary | ICD-10-CM | POA: Diagnosis not present

## 2022-01-15 DIAGNOSIS — F5221 Male erectile disorder: Secondary | ICD-10-CM | POA: Diagnosis not present

## 2022-01-15 DIAGNOSIS — I10 Essential (primary) hypertension: Secondary | ICD-10-CM | POA: Diagnosis not present

## 2022-01-15 DIAGNOSIS — M50123 Cervical disc disorder at C6-C7 level with radiculopathy: Secondary | ICD-10-CM | POA: Diagnosis not present

## 2022-01-15 DIAGNOSIS — Z0000 Encounter for general adult medical examination without abnormal findings: Secondary | ICD-10-CM | POA: Diagnosis not present

## 2022-01-15 DIAGNOSIS — N390 Urinary tract infection, site not specified: Secondary | ICD-10-CM | POA: Diagnosis not present

## 2022-01-15 DIAGNOSIS — R7303 Prediabetes: Secondary | ICD-10-CM | POA: Diagnosis not present

## 2022-01-15 DIAGNOSIS — D45 Polycythemia vera: Secondary | ICD-10-CM | POA: Diagnosis not present

## 2022-01-29 DIAGNOSIS — F1120 Opioid dependence, uncomplicated: Secondary | ICD-10-CM | POA: Diagnosis not present

## 2022-02-06 ENCOUNTER — Ambulatory Visit: Payer: Medicare HMO | Admitting: Physical Therapy

## 2022-02-11 ENCOUNTER — Ambulatory Visit: Payer: Medicare HMO | Attending: Family Medicine | Admitting: Physical Therapy

## 2022-02-11 ENCOUNTER — Encounter: Payer: Self-pay | Admitting: Physical Therapy

## 2022-02-11 ENCOUNTER — Encounter: Payer: Medicare HMO | Admitting: Physical Therapy

## 2022-02-11 DIAGNOSIS — M542 Cervicalgia: Secondary | ICD-10-CM | POA: Diagnosis not present

## 2022-02-11 NOTE — Therapy (Signed)
OUTPATIENT PHYSICAL THERAPY CERVICAL EVALUATION   Patient Name: Duane Davis MRN: [id number] DOB:November 27, 1960, 61 y.o., male Today's Date: 02/12/2022   PT End of Session - 02/11/22 0933     Visit Number 1    Number of Visits 17    Date for PT Re-Evaluation 05/03/22    Authorization - Visit Number 1    Authorization - Number of Visits 10    PT Start Time 0920    PT Stop Time 1000    PT Time Calculation (min) 40 min    Activity Tolerance Patient tolerated treatment well    Behavior During Therapy [facility] for tasks assessed/performed             Past Medical History:  Diagnosis Date   Arthritis    Diabetes mellitus without complication ([place])    Hypertension    Past Surgical History:  Procedure Laterality Date   FINGER SURGERY     left hand 4th finger and index finger reattached   Patient Active Problem List   Diagnosis Date Noted   OSA (obstructive sleep apnea) 10/26/2013    PCP: Iona Beard MD  REFERRING PROVIDER: Allene Dillon FNP  REFERRING DIAG: cervical rediculopathy  THERAPY DIAG:  Cervicalgia - Plan: PT plan of care cert/re-cert  Rationale for Evaluation and Treatment Rehabilitation  ONSET DATE: June 2023  SUBJECTIVE:                                                                                                                                                                                                         SUBJECTIVE STATEMENT: Cervical pain   PERTINENT HISTORY:  Pt is a 61 year old male presenting with L sided cervical pain with numbness/tingling into middle and fourth finger since June 2023 with insidious onset. Reports he has been taking gabapentin for the past month and it has really helped with his symptoms, reporting prior to this he was unable to sleep without LUE overhead. Currently reports n/t when his arm is in the same position for a "long time" (driving, sitting). He has better cervical motion since gabapentin, still some difficulty.  Overhead reaching, lifting, improving since beginning gabapentin still somewhat difficult. Current pain 1/10; best 0/10 worst: 10/10. 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 Pt enjoys walking his dog daily.  Pt denies N/V, B&B changes, unexplained weight fluctuation, saddle paresthesia, fever, night sweats, or unrelenting night pain at this time.   PAIN:  Are you having pain? Yes: NPRS scale: 1/10 Pain location: L lateral neck with radiation into  Pain description: neck pain feels dull/stiff and tight with n/t down LUE Aggravating factors: sitting >13mns, reaching overhead, turning head, lifting Relieving factors: gabapentin, changing position, heat  PRECAUTIONS: None  WEIGHT BEARING RESTRICTIONS No  FALLS:  Has patient fallen in last 6 months? No  LIVING ENVIRONMENT: Lives with: lives with an adult companion Lives in: House/apartment Stairs: No Has following equipment at home:  none  OCCUPATION: entrepreneur   PLOF: Independent  PATIENT GOALS decrease pain   OBJECTIVE:   DIAGNOSTIC FINDINGS:  MRI 12/07/21 IMPRESSION:  1. Examination is mildly motion degraded. No acute osseous findings,  malalignment or central spinal stenosis demonstrated.  2. As seen on recent CT, there is high-grade osseous foraminal  narrowing on the right at C6-7 due to uncinate spur. Mild to  moderate left foraminal narrowing which could affect the left C7  nerve root.  3. Mild to moderate foraminal narrowing bilaterally at C5-6 and mild  foraminal narrowing bilaterally at C4-5.   PATIENT SURVEYS:  FOTO 45 goal 621  COGNITION: Overall cognitive status: Within functional limits for tasks assessed   SENSATION: WFL  POSTURE: rounded shoulders, forward head, decreased lumbar lordosis, and increased thoracic kyphosis  PALPATION: Tension with trigger points at bilat UT.  Concordant pain at R UT/supraspinatus Tension with latent trigger points at cervical extensors, bilat levator scapulae, and mid trip/rhomboid groups  CERVICAL ROM:   Active ROM A/PROM (deg) eval  Flexion WNL  Extension WNL  Right lateral flexion 41  Left lateral flexion 29  Right rotation 61  Left rotation 49   (Blank rows = not tested)  UPPER EXTREMITY ROM:  Active ROM Right eval Left eval  Shoulder flexion WNL WNL  Shoulder extension WNL WNL  Shoulder abduction WNL WNL  Shoulder adduction    Shoulder extension WNL WNL  Shoulder internal rotation 12 T10  Shoulder external rotation C5 CTJ  Elbow flexion WNL WNL  Elbow extension WNL WNL  Wrist flexion    Wrist extension    Wrist ulnar deviation    Wrist radial deviation    Wrist pronation    Wrist supination     (Blank rows = not tested)  UPPER EXTREMITY MMT:  MMT Right eval Left eval  Shoulder flexion 4+ 4+  Shoulder extension 5 5  Shoulder abduction 5 5  Shoulder adduction    Shoulder extension 5 5  Shoulder internal rotation 5 5  Shoulder external rotation 4 4+  Middle trapezius 4 4  Lower trapezius 4- 4  Elbow flexion    Elbow extension    Wrist flexion    Wrist extension    Wrist ulnar deviation    Wrist radial deviation    Wrist pronation    Wrist supination    Grip strength     (Blank rows = not tested)  CERVICAL SPECIAL TESTS:  Upper limb tension test (ULTT): Negative, Spurling's test: Negative, and Distraction test: Negative  Subacromial Impingement Hawkins-Kennedy: Positive for bilat Neer (Block scapula, PROM flexion): Positive for bilat Painful Arc (Pain from 60 to 120 degrees scaption): Positive for bilat Empty Can: Positive for RUE External Rotation Resistance: Positive for RUE  Bicep Tendon Pathology Speed (shoulder flexion to 90, external rotation, full elbow extension, and forearm supination with resistance: Negative Yergason's (resisted shoulder ER and supination/biceps tendon  pathology): Negative   TODAY'S TREATMENT:  PT reviewed the following HEP with patient with patient  able to demonstrate a set of the following with min cuing for correction needed. PT educated patient on parameters of therex (how/when to inc/decrease intensity, frequency, rep/set range, stretch hold time, and purpose of therex) with verbalized understanding.   Access Code: [email or web address] - Seated Passive Cervical Retraction  - 8 x daily - 7 x weekly - 12 reps - 2sec hold - Seated Scapular Retraction  - 8 x daily - 7 x weekly - 12 reps - 2sec hold   PATIENT EDUCATION:  Education details: Patient was educated on diagnosis, anatomy and pathology involved, prognosis, role of PT, and was given an HEP, demonstrating exercise with proper form following verbal and tactile cues, and was given a paper hand out to continue exercise at home. Pt was educated on and agreed to plan of care.  Person educated: Patient Education method: Explanation, Demonstration, and Verbal cues Education comprehension: verbalized understanding, returned demonstration, and verbal cues required   HOME EXERCISE PROGRAM: [id number]  ASSESSMENT:  CLINICAL IMPRESSION: Patient is a 61 y.o. male who was seen today for physical therapy evaluation and treatment for cervical pain. Evaluation reveals signs and symptoms of subacromial impingement bilat, with increased pain response on RUE, specifically R supraspinatus impingement. Current pain symptoms not responsive to cervical rediculopathy testing, cannot be ruled out due to patient with subjective symptom change since beginning gabapentin. Impairments in decreased periscapular strength, decreased cervical mobility, increased tension of cervical and periscapular muscles, abnormal posture, and posture. 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 Would benefit from skilled  PT to address above deficits and promote optimal return to PLOF.    OBJECTIVE IMPAIRMENTS Abnormal gait, decreased activity tolerance, decreased coordination, decreased endurance, decreased mobility, decreased ROM, decreased strength, increased fascial restrictions, impaired perceived functional ability, increased muscle spasms, impaired flexibility, impaired sensation, impaired tone, impaired UE functional use, improper body mechanics, postural dysfunction, and pain.   ACTIVITY LIMITATIONS carrying, lifting, sitting, sleeping, reach over head, and driving, typing  PARTICIPATION LIMITATIONS: cleaning, laundry, driving, shopping, community activity, and occupation  PERSONAL FACTORS Age, Fitness, Past/current experiences, Time since onset of injury/illness/exacerbation, and 3+ comorbidities: HTN, DM2, OA  are also affecting patient's functional outcome.   REHAB POTENTIAL: Good  CLINICAL DECISION MAKING: Evolving/moderate complexity  EVALUATION COMPLEXITY: Moderate   GOALS: Goals reviewed with patient? Yes  SHORT TERM GOALS: Target date: 03/12/2022   Pt will be independent with HEP in order to improve strength and balance in order to decrease fall risk and improve function at home and work. Baseline: HEP given  Goal status: INITIAL  LONG TERM GOALS: Target date: 04/09/2022  Patient will increase FOTO score to 66 to demonstrate predicted increase in functional mobility to complete ADLs Baseline: 45 Goal status: INITIAL  2.  Pt will decrease worst pain as reported on NPRS by at least 3 points in order to demonstrate clinically significant reduction in pain.  Baseline: 10/10 with concordant activity Goal status: INITIAL  3.  Pt will demonstrate L cervical rotation of at least 60d to have adequate rotation to turn to scan environment while driving Baseline: 63Y Goal status: INITIAL  4.  Pt will demonstrate increased gross periscapular strength to at least 4+/5 MMT grade in order  to demonstrate improvement in strength needed to perform heavy household ADLs Baseline:  R/L Shoulder external rotation 4 4+  Middle trapezius 4 4  Lower trapezius 4- 4      PLAN: PT FREQUENCY:  1-2x/week  PT DURATION: 8 weeks  PLANNED INTERVENTIONS: Therapeutic exercises, Therapeutic activity, Neuromuscular re-education, Balance training, Gait training, Patient/Family education, Self Care, Joint mobilization, Joint manipulation, Dry Needling, Electrical stimulation, Spinal manipulation, Spinal mobilization, Cryotherapy, Moist heat, Traction, Ultrasound, Ionotophoresis '4mg'$ /ml Dexamethasone, Manual therapy, and Re-evaluation  PLAN FOR NEXT SESSION: HEP review, length/tension restoration   Durwin Reges DPT Durwin Reges, PT 02/12/2022, 10:43 AM

## 2022-02-12 DIAGNOSIS — F1120 Opioid dependence, uncomplicated: Secondary | ICD-10-CM | POA: Diagnosis not present

## 2022-02-14 ENCOUNTER — Ambulatory Visit: Payer: Medicare HMO | Admitting: Physical Therapy

## 2022-02-14 ENCOUNTER — Encounter: Payer: Medicare HMO | Admitting: Physical Therapy

## 2022-02-18 ENCOUNTER — Encounter: Payer: Medicare HMO | Admitting: Physical Therapy

## 2022-02-18 ENCOUNTER — Ambulatory Visit: Payer: Medicare HMO | Admitting: Physical Therapy

## 2022-02-18 ENCOUNTER — Encounter: Payer: Self-pay | Admitting: Physical Therapy

## 2022-02-18 DIAGNOSIS — M542 Cervicalgia: Secondary | ICD-10-CM | POA: Diagnosis not present

## 2022-02-18 NOTE — Therapy (Signed)
OUTPATIENT PHYSICAL THERAPY CERVICAL EVALUATION   Patient Name: Duane Davis MRN: [id number] DOB:1960/12/10, 61 y.o., male Today's Date: 02/18/2022   PT End of Session - 02/18/22 1015     Visit Number 2    Number of Visits 17    Date for PT Re-Evaluation 05/03/22    Authorization - Visit Number 2    Authorization - Number of Visits 10    PT Start Time 1017   pt arrives late   PT Stop Time 1040    PT Time Calculation (min) 23 min    Activity Tolerance Patient tolerated treatment well    Behavior During Therapy [facility] for tasks assessed/performed             Past Medical History:  Diagnosis Date   Arthritis    Diabetes mellitus without complication ([place])    Hypertension    Past Surgical History:  Procedure Laterality Date   FINGER SURGERY     left hand 4th finger and index finger reattached   Patient Active Problem List   Diagnosis Date Noted   OSA (obstructive sleep apnea) 10/26/2013    PCP: Iona Beard MD  REFERRING PROVIDER: Allene Dillon FNP  REFERRING DIAG: cervical rediculopathy  THERAPY DIAG:  Cervicalgia  Rationale for Evaluation and Treatment Rehabilitation  ONSET DATE: June 2023  SUBJECTIVE:                                                                                                                                                                                                         SUBJECTIVE STATEMENT: 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 Reports no cervical pain since last visit. Reports mostly compliant with HEP. Reports he has not had any finger numbness as well.   PERTINENT HISTORY:  Pt is a 61 year old male presenting with L sided cervical pain with numbness/tingling into middle and fourth finger since June 2023 with insidious onset. Reports he has been taking gabapentin for the past month and it has really helped with his symptoms, reporting prior to this he was unable to sleep without LUE overhead. Currently reports n/t  when his arm is in the same position for a "long time" (driving, sitting). He has better cervical motion since gabapentin, still some difficulty. Overhead reaching, lifting, improving since beginning gabapentin still somewhat difficult. Current pain 1/10; best 0/10 worst: 10/10. 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 Pt enjoys walking his dog  daily.  Pt denies N/V, B&B changes, unexplained weight fluctuation, saddle paresthesia, fever, night sweats, or unrelenting night pain at this time.   PAIN:  Are you having pain? Yes: NPRS scale: 1/10 Pain location: L lateral neck with radiation into  Pain description: neck pain feels dull/stiff and tight with n/t down LUE Aggravating factors: sitting >66mns, reaching overhead, turning head, lifting Relieving factors: gabapentin, changing position, heat  PRECAUTIONS: None  WEIGHT BEARING RESTRICTIONS No  FALLS:  Has patient fallen in last 6 months? No  LIVING ENVIRONMENT: Lives with: lives with an adult companion Lives in: House/apartment Stairs: No Has following equipment at home:  none  OCCUPATION: entrepreneur   PLOF: Independent  PATIENT GOALS decrease pain   OBJECTIVE:   DIAGNOSTIC FINDINGS:  MRI 12/07/21 IMPRESSION:  1. Examination is mildly motion degraded. No acute osseous findings,  malalignment or central spinal stenosis demonstrated.  2. As seen on recent CT, there is high-grade osseous foraminal  narrowing on the right at C6-7 due to uncinate spur. Mild to  moderate left foraminal narrowing which could affect the left C7  nerve root.  3. Mild to moderate foraminal narrowing bilaterally at C5-6 and mild  foraminal narrowing bilaterally at C4-5.   PATIENT SURVEYS:  FOTO 45 goal 661  COGNITION: Overall cognitive status: Within functional limits for tasks assessed   SENSATION: WFL  POSTURE:  rounded shoulders, forward head, decreased lumbar lordosis, and increased thoracic kyphosis  PALPATION: Tension with trigger points at bilat UT. Concordant pain at R UT/supraspinatus Tension with latent trigger points at cervical extensors, bilat levator scapulae, and mid trip/rhomboid groups  CERVICAL ROM:   Active ROM A/PROM (deg) eval  Flexion WNL  Extension WNL  Right lateral flexion 41  Left lateral flexion 29  Right rotation 61  Left rotation 49   (Blank rows = not tested)  UPPER EXTREMITY ROM:  Active ROM Right eval Left eval  Shoulder flexion WNL WNL  Shoulder extension WNL WNL  Shoulder abduction WNL WNL  Shoulder adduction    Shoulder extension WNL WNL  Shoulder internal rotation 12 T10  Shoulder external rotation C5 CTJ  Elbow flexion WNL WNL  Elbow extension WNL WNL  Wrist flexion    Wrist extension    Wrist ulnar deviation    Wrist radial deviation    Wrist pronation    Wrist supination     (Blank rows = not tested)  UPPER EXTREMITY MMT:  MMT Right eval Left eval  Shoulder flexion 4+ 4+  Shoulder extension 5 5  Shoulder abduction 5 5  Shoulder adduction    Shoulder extension 5 5  Shoulder internal rotation 5 5  Shoulder external rotation 4 4+  Middle trapezius 4 4  Lower trapezius 4- 4  Elbow flexion    Elbow extension    Wrist flexion    Wrist extension    Wrist ulnar deviation    Wrist radial deviation    Wrist pronation    Wrist supination    Grip strength     (Blank rows = not tested)  CERVICAL SPECIAL TESTS:  Upper limb tension test (ULTT): Negative, Spurling's test: Negative, and Distraction test: Negative  Subacromial Impingement Hawkins-Kennedy: Positive for bilat Neer (Block scapula, PROM flexion): Positive for bilat Painful Arc (Pain from 60 to 120 degrees scaption): Positive for bilat Empty Can: Positive for RUE External Rotation Resistance: Positive for RUE  Bicep Tendon Pathology Speed (shoulder flexion to 90,  external rotation, full elbow extension, and forearm supination  with resistance: Negative Yergason's (resisted shoulder ER and supination/biceps tendon pathology): Negative   TODAY'S TREATMENT:  Nustep seat 10 UE 15 L3 5 mins for gentle protraction/retraction strengthening and mobility with success Cervical retraction x12 with correction needed for upper cervical flex Scapular retraction x 12 with max cuing needed for tecnique Bilat ER GTB 2x 10 with cuing for ER > abd with good carry over  Prone Y 2x 10 with cuing for scapulohumeral rhythm with good carry over Post shoulder rolls x20 cuing for focus on scapular retraction + depression with understanding Doorway Pec stretch 30sec hold   PATIENT EDUCATION:  Education details: Patient was educated on diagnosis, anatomy and pathology involved, prognosis, role of PT, and was given an HEP, demonstrating exercise with proper form following verbal and tactile cues, and was given a paper hand out to continue exercise at home. Pt was educated on and agreed to plan of care.  Person educated: Patient Education method: Explanation, Demonstration, and Verbal cues Education comprehension: verbalized understanding, returned demonstration, and verbal cues required   HOME EXERCISE PROGRAM: [id number]  ASSESSMENT:  CLINICAL IMPRESSION: Session shorted d/t patient arriving late. PT initiated therex progression for increased scapular retraction and deep cervical flexor strengthening, and occipital and pec minor lengthening with success. Patient is able to comply with all cuing for proper technique of therex with good effort throughout session. PT will continue progression as able.     OBJECTIVE IMPAIRMENTS Abnormal gait, decreased activity tolerance, decreased coordination, decreased endurance, decreased mobility, decreased ROM, decreased strength, increased fascial restrictions, impaired perceived functional ability, increased muscle spasms, impaired  flexibility, impaired sensation, impaired tone, impaired UE functional use, improper body mechanics, postural dysfunction, and pain.   ACTIVITY LIMITATIONS carrying, lifting, sitting, sleeping, reach over head, and driving, typing  PARTICIPATION LIMITATIONS: cleaning, laundry, driving, shopping, community activity, and occupation  PERSONAL FACTORS Age, Fitness, Past/current experiences, Time since onset of injury/illness/exacerbation, and 3+ comorbidities: HTN, DM2, OA  are also affecting patient's functional outcome.   REHAB POTENTIAL: Good  CLINICAL DECISION MAKING: Evolving/moderate complexity  EVALUATION COMPLEXITY: Moderate   GOALS: Goals reviewed with patient? Yes  SHORT TERM GOALS: Target date: 03/18/2022   Pt will be independent with HEP in order to improve strength and balance in order to decrease fall risk and improve function at home and work. Baseline: HEP given  Goal status: INITIAL  LONG TERM GOALS: Target date: 04/15/2022  Patient will increase FOTO score to 66 to demonstrate predicted increase in functional mobility to complete ADLs Baseline: 45 Goal status: INITIAL  2.  Pt will decrease worst pain as reported on NPRS by at least 3 points in order to demonstrate clinically significant reduction in pain.  Baseline: 10/10 with concordant activity Goal status: INITIAL  3.  Pt will demonstrate L cervical rotation of at least 60d to have adequate rotation to turn to scan environment while driving Baseline: 50P Goal status: INITIAL  4.  Pt will demonstrate increased gross periscapular strength to at least 4+/5 MMT grade in order to demonstrate improvement in strength needed to perform heavy household ADLs Baseline:  R/L Shoulder external rotation 4 4+  Middle trapezius 4 4  Lower trapezius 4- 4      PLAN: PT FREQUENCY: 1-2x/week  PT DURATION: 8 weeks  PLANNED INTERVENTIONS: Therapeutic exercises, Therapeutic activity, Neuromuscular re-education, Balance  training, Gait training, Patient/Family education, Self Care, Joint mobilization, Joint manipulation, Dry Needling, Electrical stimulation, Spinal manipulation, Spinal mobilization, Cryotherapy, Moist heat, Traction, Ultrasound, Ionotophoresis '4mg'$ /ml Dexamethasone, Manual therapy,  and Re-evaluation  PLAN FOR NEXT SESSION: HEP review, length/tension restoration   Durwin Reges DPT Durwin Reges, PT 02/18/2022, 10:45 AM

## 2022-02-20 ENCOUNTER — Encounter: Payer: Medicare HMO | Admitting: Physical Therapy

## 2022-02-21 ENCOUNTER — Ambulatory Visit: Payer: Medicare HMO | Admitting: Physical Therapy

## 2022-02-25 ENCOUNTER — Ambulatory Visit: Payer: Medicare HMO | Admitting: Physical Therapy

## 2022-02-25 ENCOUNTER — Encounter: Payer: Medicare HMO | Admitting: Physical Therapy

## 2022-02-25 ENCOUNTER — Encounter: Payer: Self-pay | Admitting: Physical Therapy

## 2022-02-25 DIAGNOSIS — M542 Cervicalgia: Secondary | ICD-10-CM

## 2022-02-25 NOTE — Therapy (Signed)
OUTPATIENT PHYSICAL THERAPY CERVICAL Treatment/DC Summary   Patient Name: Duane Davis MRN: [id number] DOB:1960-08-09, 61 y.o., male Today's Date: 02/25/2022   PT End of Session - 02/25/22 1009     Visit Number 3    Number of Visits 17    Date for PT Re-Evaluation 05/03/22    Authorization - Visit Number 3    Authorization - Number of Visits 10    PT Start Time 1006    PT Stop Time 1032    PT Time Calculation (min) 26 min    Activity Tolerance Patient tolerated treatment well    Behavior During Therapy WFL for tasks assessed/performed              Past Medical History:  Diagnosis Date   Arthritis    Diabetes mellitus without complication ([place])    Hypertension    Past Surgical History:  Procedure Laterality Date   FINGER SURGERY     left hand 4th finger and index finger reattached   Patient Active Problem List   Diagnosis Date Noted   OSA (obstructive sleep apnea) 10/26/2013    PCP: Iona Beard MD  REFERRING PROVIDER: Allene Dillon FNP  REFERRING DIAG: cervical rediculopathy  THERAPY DIAG:  Cervicalgia  Rationale for Evaluation and Treatment Rehabilitation  ONSET DATE: June 2023  SUBJECTIVE:                                                                                                                                                                                                         SUBJECTIVE STATEMENT: Patient reports no pain today, and is doing very well overall. Is prepared to d/c to an HEP for maintenance of neck pain/strength, possible re-eval of lumbar pain in the future  PERTINENT HISTORY:  Pt is a 61 year old male presenting with L sided cervical pain with numbness/tingling into middle and fourth finger since June 2023 with insidious onset. Reports he has been taking gabapentin for the past month and it has really helped with his symptoms, reporting prior to this he was unable to sleep without LUE overhead. Currently reports n/t when his arm  is in the same position for a "long time" (driving, sitting). He has better cervical motion since gabapentin, still some difficulty. Overhead reaching, lifting, improving since beginning gabapentin still somewhat difficult. Current pain 1/10; best 0/10 worst: 10/10. 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 Pt enjoys walking his dog daily.  Pt denies N/V, B&B changes, unexplained weight fluctuation, saddle paresthesia, fever, night sweats, or unrelenting night pain at this time.   PAIN:  Are you having pain? Yes: NPRS scale: 1/10 Pain location: L lateral neck with radiation into  Pain description: neck pain feels dull/stiff and tight with n/t down LUE Aggravating factors: sitting >25mns, reaching overhead, turning head, lifting Relieving factors: gabapentin, changing position, heat  PRECAUTIONS: None  WEIGHT BEARING RESTRICTIONS No  FALLS:  Has patient fallen in last 6 months? No  LIVING ENVIRONMENT: Lives with: lives with an adult companion Lives in: House/apartment Stairs: No Has following equipment at home:  none  OCCUPATION: entrepreneur   PLOF: Independent  PATIENT GOALS decrease pain   OBJECTIVE:   DIAGNOSTIC FINDINGS:  MRI 12/07/21 IMPRESSION:  1. Examination is mildly motion degraded. No acute osseous findings,  malalignment or central spinal stenosis demonstrated.  2. As seen on recent CT, there is high-grade osseous foraminal  narrowing on the right at C6-7 due to uncinate spur. Mild to  moderate left foraminal narrowing which could affect the left C7  nerve root.  3. Mild to moderate foraminal narrowing bilaterally at C5-6 and mild  foraminal narrowing bilaterally at C4-5.   PATIENT SURVEYS:  FOTO 45 goal 676  COGNITION: Overall cognitive status: Within functional limits for tasks assessed   SENSATION: WFL  POSTURE: rounded shoulders,  forward head, decreased lumbar lordosis, and increased thoracic kyphosis  PALPATION: Tension with trigger points at bilat UT. Concordant pain at R UT/supraspinatus Tension with latent trigger points at cervical extensors, bilat levator scapulae, and mid trip/rhomboid groups  CERVICAL ROM:   Active ROM A/PROM (deg) eval  Flexion WNL  Extension WNL  Right lateral flexion 41  Left lateral flexion 29  Right rotation 61  Left rotation 49   (Blank rows = not tested)  UPPER EXTREMITY ROM:  Active ROM Right eval Left eval  Shoulder flexion WNL WNL  Shoulder extension WNL WNL  Shoulder abduction WNL WNL  Shoulder adduction    Shoulder extension WNL WNL  Shoulder internal rotation 12 T10  Shoulder external rotation C5 CTJ  Elbow flexion WNL WNL  Elbow extension WNL WNL  Wrist flexion    Wrist extension    Wrist ulnar deviation    Wrist radial deviation    Wrist pronation    Wrist supination     (Blank rows = not tested)  UPPER EXTREMITY MMT:  MMT Right eval Left eval  Shoulder flexion 4+ 4+  Shoulder extension 5 5  Shoulder abduction 5 5  Shoulder adduction    Shoulder extension 5 5  Shoulder internal rotation 5 5  Shoulder external rotation 4 4+  Middle trapezius 4 4  Lower trapezius 4- 4  Elbow flexion    Elbow extension    Wrist flexion    Wrist extension    Wrist ulnar deviation    Wrist radial deviation    Wrist pronation    Wrist supination    Grip strength     (Blank rows = not tested)  CERVICAL SPECIAL TESTS:  Upper limb tension test (ULTT): Negative, Spurling's test: Negative, and Distraction test: Negative  Subacromial Impingement Hawkins-Kennedy: Positive for bilat Neer (Block scapula, PROM flexion): Positive for bilat Painful Arc (Pain from 60 to 120 degrees scaption): Positive for bilat Empty Can: Positive for RUE External Rotation Resistance: Positive for RUE  Bicep Tendon Pathology Speed (shoulder flexion to 90, external rotation, full  elbow extension, and forearm supination with resistance:  Negative Yergason's (resisted shoulder ER and supination/biceps tendon pathology): Negative   TODAY'S TREATMENT:  Nustep seat 10 UE 15 L3 5 mins for gentle protraction/retraction strengthening and mobility with success  PT reviewed the following HEP with patient with patient able to demonstrate a set of the following with min cuing for correction needed. PT educated patient on parameters of therex (how/when to inc/decrease intensity, frequency, rep/set range, stretch hold time, and purpose of therex) with verbalized understanding.   Access Code: [facility] - Standing Shoulder External Rotation with Resistance  - 1 x daily - 2 x weekly - 3 sets - 8-12 reps - Standing Bent Over Shoulder Row  - 1 x daily - 2 x weekly - 3 sets - 8-12 reps - Prone Scapular Retraction Y  - 1 x daily - 2 x weekly - 3 sets - 8-12 reps - Doorway Pec Stretch at 90 Degrees Abduction  - 1-3 x daily - 7 x weekly - 45-60sec hold - Seated Passive Cervical Retraction  - 8 x daily - 7 x weekly - 12 reps   PATIENT EDUCATION:  Education details: Patient was educated on diagnosis, anatomy and pathology involved, prognosis, role of PT, and was given an HEP, demonstrating exercise with proper form following verbal and tactile cues, and was given a paper hand out to continue exercise at home. Pt was educated on and agreed to plan of care.  Person educated: Patient Education method: Explanation, Demonstration, and Verbal cues Education comprehension: verbalized understanding, returned demonstration, and verbal cues required   HOME EXERCISE PROGRAM: [id number]  ASSESSMENT:  CLINICAL IMPRESSION: PT reassessed goals this session where patient has met all goals to safely d/c formal PT. Patient is able to demonstrate and verbalize understanding of all HEP recommendations with minimal corrections needed. Pt given clinic contact info should further questions or concerns arise. Pt  to d/c PT.      OBJECTIVE IMPAIRMENTS Abnormal gait, decreased activity tolerance, decreased coordination, decreased endurance, decreased mobility, decreased ROM, decreased strength, increased fascial restrictions, impaired perceived functional ability, increased muscle spasms, impaired flexibility, impaired sensation, impaired tone, impaired UE functional use, improper body mechanics, postural dysfunction, and pain.   ACTIVITY LIMITATIONS carrying, lifting, sitting, sleeping, reach over head, and driving, typing  PARTICIPATION LIMITATIONS: cleaning, laundry, driving, shopping, community activity, and occupation  PERSONAL FACTORS Age, Fitness, Past/current experiences, Time since onset of injury/illness/exacerbation, and 3+ comorbidities: HTN, DM2, OA  are also affecting patient's functional outcome.   REHAB POTENTIAL: Good  CLINICAL DECISION MAKING: Evolving/moderate complexity  EVALUATION COMPLEXITY: Moderate   GOALS: Goals reviewed with patient? Yes  SHORT TERM GOALS: Target date: 03/25/2022   Pt will be independent with HEP in order to improve strength and balance in order to decrease fall risk and improve function at home and work. Baseline: HEP given  Goal status: INITIAL  LONG TERM GOALS: Target date: 04/22/2022  Patient will increase FOTO score to 66 to demonstrate predicted increase in functional mobility to complete ADLs Baseline: 45; 02/25/22 99 Goal status: achieved   2.  Pt will decrease worst pain as reported on NPRS by at least 3 points in order to demonstrate clinically significant reduction in pain.  Baseline: 10/10 with concordant activity; 02/25/22 0/10 pain Goal status: achieved   3.  Pt will demonstrate L cervical rotation of at least 60d to have adequate rotation to turn to scan environment while driving Baseline: 54S; 02/25/22 full mobility  Goal status: achieved   4.  Pt will demonstrate increased gross periscapular  strength to at least 4+/5 MMT  grade in order to demonstrate improvement in strength needed to perform heavy household ADLs Baseline:  R/L Shoulder external rotation 4 4+  Middle trapezius 4 4  Lower trapezius 4- 4   Shoulder external rotation 4 5  Middle trapezius 4+ 4+  Lower trapezius 4+ 4+   Partially achieved     PLAN: PT FREQUENCY: 1-2x/week  PT DURATION: 8 weeks  PLANNED INTERVENTIONS: Therapeutic exercises, Therapeutic activity, Neuromuscular re-education, Balance training, Gait training, Patient/Family education, Self Care, Joint mobilization, Joint manipulation, Dry Needling, Electrical stimulation, Spinal manipulation, Spinal mobilization, Cryotherapy, Moist heat, Traction, Ultrasound, Ionotophoresis 49m/ml Dexamethasone, Manual therapy, and Re-evaluation  PLAN FOR NEXT SESSION: HEP review, length/tension restoration   CDurwin RegesDPT CDurwin Reges PT 02/25/2022, 10:39 AM

## 2022-02-26 DIAGNOSIS — F1120 Opioid dependence, uncomplicated: Secondary | ICD-10-CM | POA: Diagnosis not present

## 2022-02-28 ENCOUNTER — Encounter: Payer: Medicare HMO | Admitting: Physical Therapy

## 2022-02-28 ENCOUNTER — Ambulatory Visit: Payer: Medicare HMO | Admitting: Physical Therapy

## 2022-03-05 ENCOUNTER — Encounter: Payer: Medicare HMO | Admitting: Physical Therapy

## 2022-03-07 ENCOUNTER — Encounter: Payer: Medicare HMO | Admitting: Physical Therapy

## 2022-03-12 ENCOUNTER — Encounter: Payer: Medicare HMO | Admitting: Physical Therapy

## 2022-03-12 DIAGNOSIS — F1120 Opioid dependence, uncomplicated: Secondary | ICD-10-CM | POA: Diagnosis not present

## 2022-03-14 ENCOUNTER — Encounter: Payer: Medicare HMO | Admitting: Physical Therapy

## 2022-03-18 ENCOUNTER — Encounter: Payer: Medicare HMO | Admitting: Physical Therapy

## 2022-03-21 ENCOUNTER — Encounter: Payer: Medicare HMO | Admitting: Physical Therapy

## 2022-03-25 ENCOUNTER — Encounter: Payer: Medicare HMO | Admitting: Physical Therapy

## 2022-03-26 DIAGNOSIS — F1120 Opioid dependence, uncomplicated: Secondary | ICD-10-CM | POA: Diagnosis not present

## 2022-03-28 ENCOUNTER — Encounter: Payer: Medicare HMO | Admitting: Physical Therapy

## 2022-03-28 DIAGNOSIS — F1120 Opioid dependence, uncomplicated: Secondary | ICD-10-CM | POA: Diagnosis not present

## 2022-04-01 ENCOUNTER — Encounter: Payer: Medicare HMO | Admitting: Physical Therapy

## 2022-04-08 ENCOUNTER — Encounter: Payer: Medicare HMO | Admitting: Physical Therapy

## 2022-04-09 DIAGNOSIS — F1120 Opioid dependence, uncomplicated: Secondary | ICD-10-CM | POA: Diagnosis not present

## 2022-04-11 ENCOUNTER — Encounter: Payer: Medicare HMO | Admitting: Physical Therapy

## 2022-04-12 DIAGNOSIS — F1120 Opioid dependence, uncomplicated: Secondary | ICD-10-CM | POA: Diagnosis not present

## 2022-04-16 DIAGNOSIS — R7401 Elevation of levels of liver transaminase levels: Secondary | ICD-10-CM | POA: Diagnosis not present

## 2022-04-16 DIAGNOSIS — E785 Hyperlipidemia, unspecified: Secondary | ICD-10-CM | POA: Diagnosis not present

## 2022-04-16 DIAGNOSIS — R7303 Prediabetes: Secondary | ICD-10-CM | POA: Diagnosis not present

## 2022-04-16 DIAGNOSIS — Z6836 Body mass index (BMI) 36.0-36.9, adult: Secondary | ICD-10-CM | POA: Diagnosis not present

## 2022-04-16 DIAGNOSIS — F5221 Male erectile disorder: Secondary | ICD-10-CM | POA: Diagnosis not present

## 2022-04-16 DIAGNOSIS — G4733 Obstructive sleep apnea (adult) (pediatric): Secondary | ICD-10-CM | POA: Diagnosis not present

## 2022-04-16 DIAGNOSIS — D45 Polycythemia vera: Secondary | ICD-10-CM | POA: Diagnosis not present

## 2022-04-16 DIAGNOSIS — I10 Essential (primary) hypertension: Secondary | ICD-10-CM | POA: Diagnosis not present

## 2022-04-23 DIAGNOSIS — F1120 Opioid dependence, uncomplicated: Secondary | ICD-10-CM | POA: Diagnosis not present

## 2022-05-07 DIAGNOSIS — F1120 Opioid dependence, uncomplicated: Secondary | ICD-10-CM | POA: Diagnosis not present

## 2022-05-21 DIAGNOSIS — F1120 Opioid dependence, uncomplicated: Secondary | ICD-10-CM | POA: Diagnosis not present

## 2022-05-27 DIAGNOSIS — R948 Abnormal results of function studies of other organs and systems: Secondary | ICD-10-CM | POA: Diagnosis not present

## 2022-05-27 DIAGNOSIS — E291 Testicular hypofunction: Secondary | ICD-10-CM | POA: Diagnosis not present

## 2022-06-03 DIAGNOSIS — E291 Testicular hypofunction: Secondary | ICD-10-CM | POA: Diagnosis not present

## 2022-06-03 DIAGNOSIS — N5201 Erectile dysfunction due to arterial insufficiency: Secondary | ICD-10-CM | POA: Diagnosis not present

## 2022-06-04 DIAGNOSIS — F1120 Opioid dependence, uncomplicated: Secondary | ICD-10-CM | POA: Diagnosis not present

## 2022-06-14 DIAGNOSIS — F1120 Opioid dependence, uncomplicated: Secondary | ICD-10-CM | POA: Diagnosis not present

## 2022-06-18 DIAGNOSIS — F1120 Opioid dependence, uncomplicated: Secondary | ICD-10-CM | POA: Diagnosis not present

## 2022-07-02 DIAGNOSIS — F1120 Opioid dependence, uncomplicated: Secondary | ICD-10-CM | POA: Diagnosis not present

## 2022-07-16 DIAGNOSIS — F1120 Opioid dependence, uncomplicated: Secondary | ICD-10-CM | POA: Diagnosis not present

## 2022-07-16 DIAGNOSIS — G4733 Obstructive sleep apnea (adult) (pediatric): Secondary | ICD-10-CM | POA: Diagnosis not present

## 2022-07-16 DIAGNOSIS — I10 Essential (primary) hypertension: Secondary | ICD-10-CM | POA: Diagnosis not present

## 2022-07-16 DIAGNOSIS — E7800 Pure hypercholesterolemia, unspecified: Secondary | ICD-10-CM | POA: Diagnosis not present

## 2022-07-16 DIAGNOSIS — F5221 Male erectile disorder: Secondary | ICD-10-CM | POA: Diagnosis not present

## 2022-07-16 DIAGNOSIS — D45 Polycythemia vera: Secondary | ICD-10-CM | POA: Diagnosis not present

## 2022-07-16 DIAGNOSIS — R7303 Prediabetes: Secondary | ICD-10-CM | POA: Diagnosis not present

## 2022-07-30 DIAGNOSIS — F1120 Opioid dependence, uncomplicated: Secondary | ICD-10-CM | POA: Diagnosis not present

## 2022-08-06 IMAGING — CR DG SHOULDER 2+V*R*
2 series · 2 of 2 positions shown · non-contrast
Comparison: None

CLINICAL DATA: Right shoulder pain status post fall

EXAM:
RIGHT SHOULDER - 2+ VIEW

[w shoulder external right]
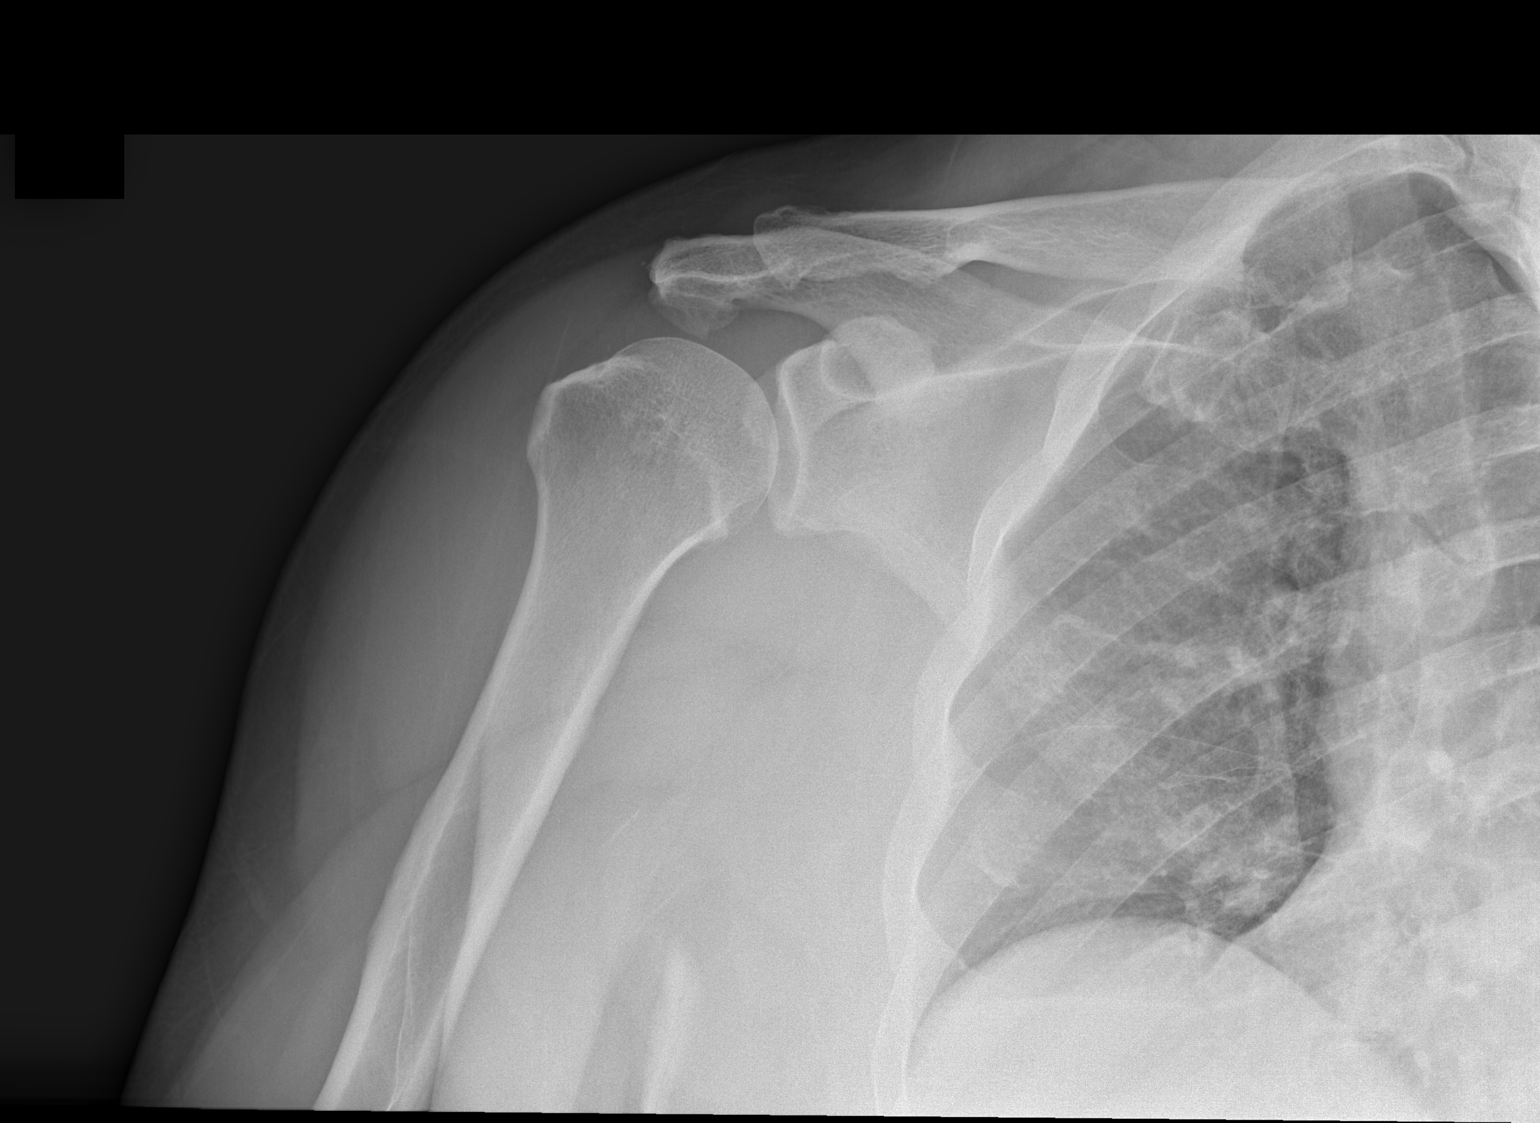

[w shoulder y-view right]
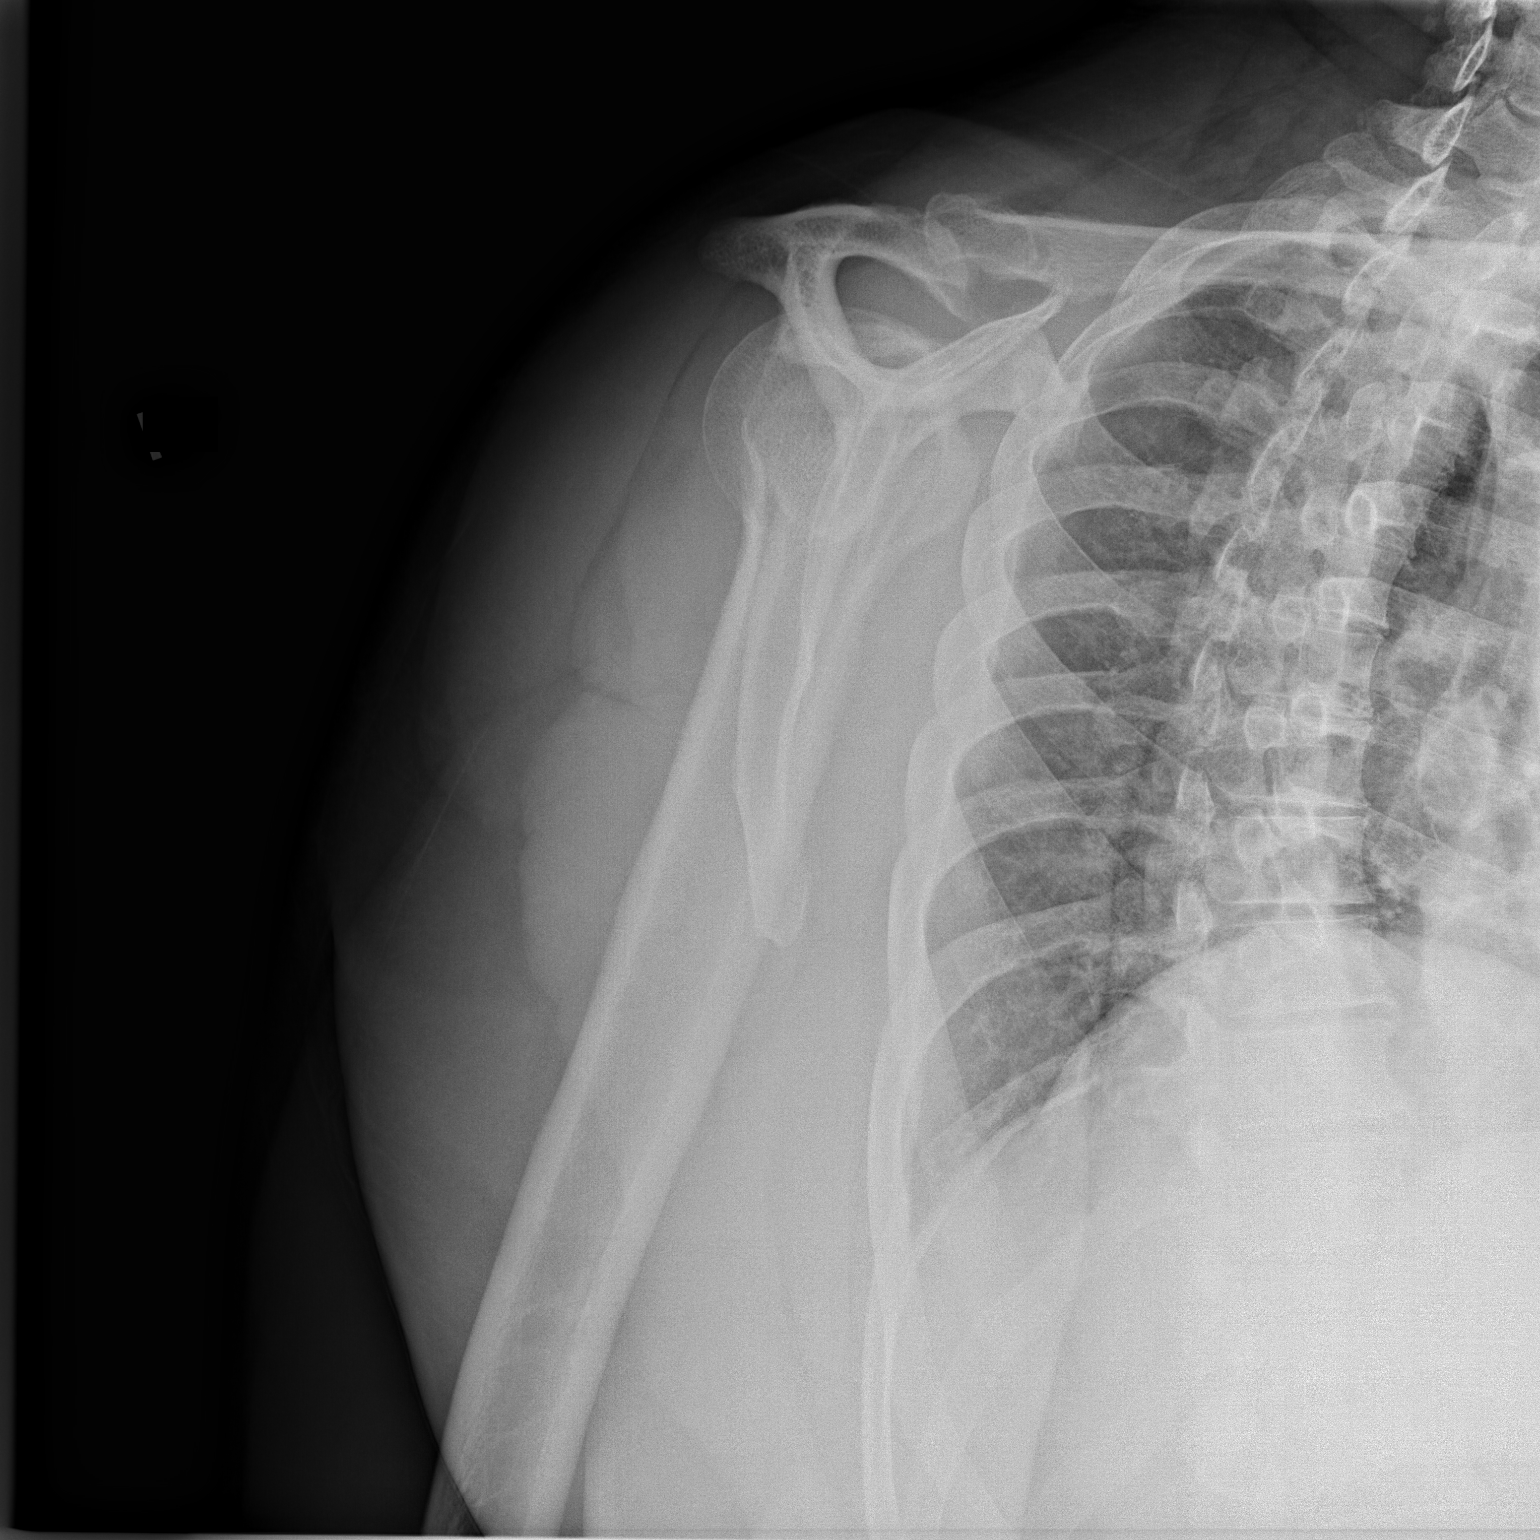

[2 of 2 positions shown; findings below may reference images not displayed]

FINDINGS: Prominent subacromial spur is noted. Irregularity of the greater
tuberosity suspicious for rotator cuff tendinopathy. No acute
fracture or dislocation. Visualized portion of the right chest are
unremarkable.
IMPRESSION: No acute abnormality of the right shoulder

## 2022-08-13 DIAGNOSIS — F1120 Opioid dependence, uncomplicated: Secondary | ICD-10-CM | POA: Diagnosis not present

## 2022-08-23 DIAGNOSIS — F1120 Opioid dependence, uncomplicated: Secondary | ICD-10-CM | POA: Diagnosis not present

## 2022-08-27 DIAGNOSIS — F1120 Opioid dependence, uncomplicated: Secondary | ICD-10-CM | POA: Diagnosis not present

## 2022-09-10 DIAGNOSIS — F1120 Opioid dependence, uncomplicated: Secondary | ICD-10-CM | POA: Diagnosis not present

## 2022-09-22 DIAGNOSIS — F1120 Opioid dependence, uncomplicated: Secondary | ICD-10-CM | POA: Diagnosis not present

## 2022-09-24 DIAGNOSIS — F1120 Opioid dependence, uncomplicated: Secondary | ICD-10-CM | POA: Diagnosis not present

## 2022-10-06 DIAGNOSIS — F1120 Opioid dependence, uncomplicated: Secondary | ICD-10-CM | POA: Diagnosis not present

## 2022-10-20 DIAGNOSIS — F1120 Opioid dependence, uncomplicated: Secondary | ICD-10-CM | POA: Diagnosis not present

## 2022-10-22 DIAGNOSIS — F1120 Opioid dependence, uncomplicated: Secondary | ICD-10-CM | POA: Diagnosis not present

## 2022-11-03 DIAGNOSIS — F1120 Opioid dependence, uncomplicated: Secondary | ICD-10-CM | POA: Diagnosis not present

## 2022-11-17 DIAGNOSIS — F1120 Opioid dependence, uncomplicated: Secondary | ICD-10-CM | POA: Diagnosis not present

## 2022-11-19 DIAGNOSIS — E7800 Pure hypercholesterolemia, unspecified: Secondary | ICD-10-CM | POA: Diagnosis not present

## 2022-11-19 DIAGNOSIS — G4733 Obstructive sleep apnea (adult) (pediatric): Secondary | ICD-10-CM | POA: Diagnosis not present

## 2022-11-19 DIAGNOSIS — F1120 Opioid dependence, uncomplicated: Secondary | ICD-10-CM | POA: Diagnosis not present

## 2022-11-19 DIAGNOSIS — I10 Essential (primary) hypertension: Secondary | ICD-10-CM | POA: Diagnosis not present

## 2022-11-19 DIAGNOSIS — E669 Obesity, unspecified: Secondary | ICD-10-CM | POA: Diagnosis not present

## 2022-11-19 DIAGNOSIS — Z6836 Body mass index (BMI) 36.0-36.9, adult: Secondary | ICD-10-CM | POA: Diagnosis not present

## 2022-11-19 DIAGNOSIS — D45 Polycythemia vera: Secondary | ICD-10-CM | POA: Diagnosis not present

## 2022-11-19 DIAGNOSIS — R7303 Prediabetes: Secondary | ICD-10-CM | POA: Diagnosis not present

## 2022-11-28 DIAGNOSIS — E291 Testicular hypofunction: Secondary | ICD-10-CM | POA: Diagnosis not present

## 2022-12-01 DIAGNOSIS — F1120 Opioid dependence, uncomplicated: Secondary | ICD-10-CM | POA: Diagnosis not present

## 2022-12-05 DIAGNOSIS — N5201 Erectile dysfunction due to arterial insufficiency: Secondary | ICD-10-CM | POA: Diagnosis not present

## 2022-12-05 DIAGNOSIS — E291 Testicular hypofunction: Secondary | ICD-10-CM | POA: Diagnosis not present

## 2022-12-15 DIAGNOSIS — F1120 Opioid dependence, uncomplicated: Secondary | ICD-10-CM | POA: Diagnosis not present

## 2022-12-29 DIAGNOSIS — F1120 Opioid dependence, uncomplicated: Secondary | ICD-10-CM | POA: Diagnosis not present

## 2023-01-01 DIAGNOSIS — F1120 Opioid dependence, uncomplicated: Secondary | ICD-10-CM | POA: Diagnosis not present

## 2023-01-12 DIAGNOSIS — F1120 Opioid dependence, uncomplicated: Secondary | ICD-10-CM | POA: Diagnosis not present

## 2023-01-15 DIAGNOSIS — F1120 Opioid dependence, uncomplicated: Secondary | ICD-10-CM | POA: Diagnosis not present

## 2023-01-21 DIAGNOSIS — H524 Presbyopia: Secondary | ICD-10-CM | POA: Diagnosis not present

## 2023-01-21 DIAGNOSIS — E119 Type 2 diabetes mellitus without complications: Secondary | ICD-10-CM | POA: Diagnosis not present

## 2023-01-23 DIAGNOSIS — F1120 Opioid dependence, uncomplicated: Secondary | ICD-10-CM | POA: Diagnosis not present

## 2023-01-26 DIAGNOSIS — F1120 Opioid dependence, uncomplicated: Secondary | ICD-10-CM | POA: Diagnosis not present

## 2023-02-09 DIAGNOSIS — F1120 Opioid dependence, uncomplicated: Secondary | ICD-10-CM | POA: Diagnosis not present

## 2023-02-12 DIAGNOSIS — F1120 Opioid dependence, uncomplicated: Secondary | ICD-10-CM | POA: Diagnosis not present

## 2023-02-23 DIAGNOSIS — F1120 Opioid dependence, uncomplicated: Secondary | ICD-10-CM | POA: Diagnosis not present

## 2023-03-09 DIAGNOSIS — F1120 Opioid dependence, uncomplicated: Secondary | ICD-10-CM | POA: Diagnosis not present

## 2023-03-19 DIAGNOSIS — F1120 Opioid dependence, uncomplicated: Secondary | ICD-10-CM | POA: Diagnosis not present

## 2023-03-23 DIAGNOSIS — F1120 Opioid dependence, uncomplicated: Secondary | ICD-10-CM | POA: Diagnosis not present

## 2023-03-24 DIAGNOSIS — D45 Polycythemia vera: Secondary | ICD-10-CM | POA: Diagnosis not present

## 2023-03-24 DIAGNOSIS — E663 Overweight: Secondary | ICD-10-CM | POA: Diagnosis not present

## 2023-03-24 DIAGNOSIS — Z0000 Encounter for general adult medical examination without abnormal findings: Secondary | ICD-10-CM | POA: Diagnosis not present

## 2023-03-24 DIAGNOSIS — E7800 Pure hypercholesterolemia, unspecified: Secondary | ICD-10-CM | POA: Diagnosis not present

## 2023-03-24 DIAGNOSIS — G4733 Obstructive sleep apnea (adult) (pediatric): Secondary | ICD-10-CM | POA: Diagnosis not present

## 2023-03-24 DIAGNOSIS — Z23 Encounter for immunization: Secondary | ICD-10-CM | POA: Diagnosis not present

## 2023-03-24 DIAGNOSIS — I10 Essential (primary) hypertension: Secondary | ICD-10-CM | POA: Diagnosis not present

## 2023-03-24 DIAGNOSIS — R7303 Prediabetes: Secondary | ICD-10-CM | POA: Diagnosis not present

## 2023-03-26 DIAGNOSIS — F1120 Opioid dependence, uncomplicated: Secondary | ICD-10-CM | POA: Diagnosis not present

## 2023-04-01 DIAGNOSIS — R7303 Prediabetes: Secondary | ICD-10-CM | POA: Diagnosis not present

## 2023-04-01 DIAGNOSIS — Z125 Encounter for screening for malignant neoplasm of prostate: Secondary | ICD-10-CM | POA: Diagnosis not present

## 2023-04-01 DIAGNOSIS — R972 Elevated prostate specific antigen [PSA]: Secondary | ICD-10-CM | POA: Diagnosis not present

## 2023-04-01 DIAGNOSIS — Z0001 Encounter for general adult medical examination with abnormal findings: Secondary | ICD-10-CM | POA: Diagnosis not present

## 2023-04-01 DIAGNOSIS — Z139 Encounter for screening, unspecified: Secondary | ICD-10-CM | POA: Diagnosis not present

## 2023-04-06 DIAGNOSIS — F1120 Opioid dependence, uncomplicated: Secondary | ICD-10-CM | POA: Diagnosis not present

## 2023-04-18 DIAGNOSIS — F1120 Opioid dependence, uncomplicated: Secondary | ICD-10-CM | POA: Diagnosis not present

## 2023-04-20 DIAGNOSIS — F1120 Opioid dependence, uncomplicated: Secondary | ICD-10-CM | POA: Diagnosis not present

## 2023-05-04 DIAGNOSIS — F1120 Opioid dependence, uncomplicated: Secondary | ICD-10-CM | POA: Diagnosis not present

## 2023-05-18 DIAGNOSIS — F1120 Opioid dependence, uncomplicated: Secondary | ICD-10-CM | POA: Diagnosis not present

## 2023-06-01 DIAGNOSIS — F1120 Opioid dependence, uncomplicated: Secondary | ICD-10-CM | POA: Diagnosis not present

## 2023-06-04 DIAGNOSIS — F1120 Opioid dependence, uncomplicated: Secondary | ICD-10-CM | POA: Diagnosis not present

## 2023-06-15 DIAGNOSIS — F1120 Opioid dependence, uncomplicated: Secondary | ICD-10-CM | POA: Diagnosis not present

## 2023-06-18 DIAGNOSIS — F1120 Opioid dependence, uncomplicated: Secondary | ICD-10-CM | POA: Diagnosis not present

## 2023-06-24 DIAGNOSIS — E7800 Pure hypercholesterolemia, unspecified: Secondary | ICD-10-CM | POA: Diagnosis not present

## 2023-06-24 DIAGNOSIS — F5221 Male erectile disorder: Secondary | ICD-10-CM | POA: Diagnosis not present

## 2023-06-24 DIAGNOSIS — D45 Polycythemia vera: Secondary | ICD-10-CM | POA: Diagnosis not present

## 2023-06-24 DIAGNOSIS — Z6836 Body mass index (BMI) 36.0-36.9, adult: Secondary | ICD-10-CM | POA: Diagnosis not present

## 2023-06-24 DIAGNOSIS — E663 Overweight: Secondary | ICD-10-CM | POA: Diagnosis not present

## 2023-06-24 DIAGNOSIS — I10 Essential (primary) hypertension: Secondary | ICD-10-CM | POA: Diagnosis not present

## 2023-06-24 DIAGNOSIS — G4733 Obstructive sleep apnea (adult) (pediatric): Secondary | ICD-10-CM | POA: Diagnosis not present

## 2023-06-24 DIAGNOSIS — Z0000 Encounter for general adult medical examination without abnormal findings: Secondary | ICD-10-CM | POA: Diagnosis not present

## 2023-06-24 DIAGNOSIS — R7303 Prediabetes: Secondary | ICD-10-CM | POA: Diagnosis not present

## 2023-06-29 DIAGNOSIS — F1120 Opioid dependence, uncomplicated: Secondary | ICD-10-CM | POA: Diagnosis not present

## 2023-07-01 DIAGNOSIS — R948 Abnormal results of function studies of other organs and systems: Secondary | ICD-10-CM | POA: Diagnosis not present

## 2023-07-01 DIAGNOSIS — E291 Testicular hypofunction: Secondary | ICD-10-CM | POA: Diagnosis not present

## 2023-07-07 DIAGNOSIS — E291 Testicular hypofunction: Secondary | ICD-10-CM | POA: Diagnosis not present

## 2023-07-07 DIAGNOSIS — N5201 Erectile dysfunction due to arterial insufficiency: Secondary | ICD-10-CM | POA: Diagnosis not present

## 2023-07-13 DIAGNOSIS — F1120 Opioid dependence, uncomplicated: Secondary | ICD-10-CM | POA: Diagnosis not present

## 2023-07-16 DIAGNOSIS — F1120 Opioid dependence, uncomplicated: Secondary | ICD-10-CM | POA: Diagnosis not present

## 2023-07-23 DIAGNOSIS — F1120 Opioid dependence, uncomplicated: Secondary | ICD-10-CM | POA: Diagnosis not present

## 2023-07-27 DIAGNOSIS — F1120 Opioid dependence, uncomplicated: Secondary | ICD-10-CM | POA: Diagnosis not present

## 2023-07-29 ENCOUNTER — Other Ambulatory Visit: Payer: Self-pay | Admitting: Family Medicine

## 2023-07-29 ENCOUNTER — Ambulatory Visit
Admission: RE | Admit: 2023-07-29 | Discharge: 2023-07-29 | Disposition: A | Discharge status: Home / Self Care | Source: Ambulatory Visit | Attending: Family Medicine | Admitting: Family Medicine

## 2023-07-29 DIAGNOSIS — M79642 Pain in left hand: Secondary | ICD-10-CM

## 2023-07-29 DIAGNOSIS — M79641 Pain in right hand: Secondary | ICD-10-CM | POA: Diagnosis not present

## 2023-07-29 DIAGNOSIS — G8929 Other chronic pain: Secondary | ICD-10-CM | POA: Diagnosis not present

## 2023-07-29 DIAGNOSIS — M25542 Pain in joints of left hand: Secondary | ICD-10-CM | POA: Diagnosis not present

## 2023-07-29 DIAGNOSIS — M25541 Pain in joints of right hand: Secondary | ICD-10-CM | POA: Diagnosis not present

## 2023-07-29 DIAGNOSIS — R7303 Prediabetes: Secondary | ICD-10-CM | POA: Diagnosis not present

## 2023-08-10 DIAGNOSIS — F1120 Opioid dependence, uncomplicated: Secondary | ICD-10-CM | POA: Diagnosis not present

## 2023-08-13 DIAGNOSIS — F1120 Opioid dependence, uncomplicated: Secondary | ICD-10-CM | POA: Diagnosis not present

## 2023-08-24 DIAGNOSIS — F1120 Opioid dependence, uncomplicated: Secondary | ICD-10-CM | POA: Diagnosis not present

## 2023-08-26 DIAGNOSIS — M65321 Trigger finger, right index finger: Secondary | ICD-10-CM | POA: Diagnosis not present

## 2023-08-26 DIAGNOSIS — M25542 Pain in joints of left hand: Secondary | ICD-10-CM | POA: Diagnosis not present

## 2023-08-26 DIAGNOSIS — M25541 Pain in joints of right hand: Secondary | ICD-10-CM | POA: Diagnosis not present

## 2023-09-07 DIAGNOSIS — F1120 Opioid dependence, uncomplicated: Secondary | ICD-10-CM | POA: Diagnosis not present

## 2023-09-12 DIAGNOSIS — M109 Gout, unspecified: Secondary | ICD-10-CM | POA: Diagnosis not present

## 2023-09-21 DIAGNOSIS — F1120 Opioid dependence, uncomplicated: Secondary | ICD-10-CM | POA: Diagnosis not present

## 2023-09-24 DIAGNOSIS — F1120 Opioid dependence, uncomplicated: Secondary | ICD-10-CM | POA: Diagnosis not present

## 2023-09-25 ENCOUNTER — Other Ambulatory Visit (HOSPITAL_COMMUNITY): Payer: Self-pay

## 2023-10-05 DIAGNOSIS — F1120 Opioid dependence, uncomplicated: Secondary | ICD-10-CM | POA: Diagnosis not present

## 2023-10-19 DIAGNOSIS — F1120 Opioid dependence, uncomplicated: Secondary | ICD-10-CM | POA: Diagnosis not present

## 2023-10-21 DIAGNOSIS — M25541 Pain in joints of right hand: Secondary | ICD-10-CM | POA: Diagnosis not present

## 2023-10-21 DIAGNOSIS — R7303 Prediabetes: Secondary | ICD-10-CM | POA: Diagnosis not present

## 2023-10-21 DIAGNOSIS — M25542 Pain in joints of left hand: Secondary | ICD-10-CM | POA: Diagnosis not present

## 2023-10-21 DIAGNOSIS — M65321 Trigger finger, right index finger: Secondary | ICD-10-CM | POA: Diagnosis not present

## 2023-10-22 DIAGNOSIS — F1120 Opioid dependence, uncomplicated: Secondary | ICD-10-CM | POA: Diagnosis not present

## 2023-11-02 DIAGNOSIS — F1120 Opioid dependence, uncomplicated: Secondary | ICD-10-CM | POA: Diagnosis not present

## 2023-11-16 DIAGNOSIS — F1120 Opioid dependence, uncomplicated: Secondary | ICD-10-CM | POA: Diagnosis not present

## 2023-11-19 DIAGNOSIS — F1120 Opioid dependence, uncomplicated: Secondary | ICD-10-CM | POA: Diagnosis not present

## 2023-11-27 DIAGNOSIS — L700 Acne vulgaris: Secondary | ICD-10-CM | POA: Diagnosis not present

## 2023-11-30 DIAGNOSIS — F1120 Opioid dependence, uncomplicated: Secondary | ICD-10-CM | POA: Diagnosis not present

## 2023-12-14 DIAGNOSIS — F1120 Opioid dependence, uncomplicated: Secondary | ICD-10-CM | POA: Diagnosis not present

## 2023-12-16 DIAGNOSIS — E291 Testicular hypofunction: Secondary | ICD-10-CM | POA: Diagnosis not present

## 2023-12-17 DIAGNOSIS — F1120 Opioid dependence, uncomplicated: Secondary | ICD-10-CM | POA: Diagnosis not present

## 2023-12-26 ENCOUNTER — Encounter: Payer: Self-pay | Admitting: Gastroenterology

## 2023-12-28 DIAGNOSIS — F1120 Opioid dependence, uncomplicated: Secondary | ICD-10-CM | POA: Diagnosis not present

## 2024-01-11 DIAGNOSIS — F1120 Opioid dependence, uncomplicated: Secondary | ICD-10-CM | POA: Diagnosis not present

## 2024-01-14 DIAGNOSIS — F1120 Opioid dependence, uncomplicated: Secondary | ICD-10-CM | POA: Diagnosis not present

## 2024-01-16 ENCOUNTER — Ambulatory Visit: Admitting: *Deleted

## 2024-01-16 VITALS — Ht 71.0 in | Wt 225.0 lb

## 2024-01-16 DIAGNOSIS — Z1211 Encounter for screening for malignant neoplasm of colon: Secondary | ICD-10-CM

## 2024-01-16 MED ORDER — NA SULFATE-K SULFATE-MG SULF 17.5-3.13-1.6 GM/177ML PO SOLN: 1.0000 | Freq: Once | ORAL | 0 refills | Status: AC

## 2024-01-16 NOTE — Progress Notes (Signed)
 Pt's name and DOB verified at the beginning of the pre-visit with 2 identifiers  Pt denies any difficulty with ambulating,sitting, laying down or rolling side to side  Pt has no issues moving head neck or swallowing  No egg or soy allergy known to patient   No issues known to pt with past sedation  No FH of Malignant Hyperthermia  Pt is not on home 02   Pt is not on blood thinners   Pt has frequent issues with constipation RN instructed pt to use Miralax per bottles instructions a week before prep days. Pt states they will  Pt is not on dialysis Hx of heart murmur  Pt denies any upcoming cardiac testing  Patient's chart reviewed by Norleen Schillings CNRA prior to pre-visit and patient appropriate for the [facility].  Pre-visit completed and red dot placed by patient's name on their procedure day (on provider's schedule).    Visit by phone  Pt states weight is 225 lb  Pt given  both [facility] main # and MD on call # prior to instructions.  Informed pt to come in at the time discussed and is shown on PV instructions.  Pt instructed to use [employer] or GoodRx for a price reduction on prep  Instructed pt where to find PV instructions in My Chart  Instructed pt on all aspects of written instructions including med holds clothing to wear and foods to eat and not eat as well as after procedure legal restrictions and to call MD on call if needed.. Pt states understanding. Instructed pt to review instructions again prior to procedure and call main # given if has any questions or any issues. Pt states they will.  SABRA

## 2024-01-17 DIAGNOSIS — E119 Type 2 diabetes mellitus without complications: Secondary | ICD-10-CM | POA: Diagnosis not present

## 2024-01-20 DIAGNOSIS — D45 Polycythemia vera: Secondary | ICD-10-CM | POA: Diagnosis not present

## 2024-01-20 DIAGNOSIS — I10 Essential (primary) hypertension: Secondary | ICD-10-CM | POA: Diagnosis not present

## 2024-01-20 DIAGNOSIS — R7303 Prediabetes: Secondary | ICD-10-CM | POA: Diagnosis not present

## 2024-01-20 DIAGNOSIS — G4733 Obstructive sleep apnea (adult) (pediatric): Secondary | ICD-10-CM | POA: Diagnosis not present

## 2024-01-23 DIAGNOSIS — F1120 Opioid dependence, uncomplicated: Secondary | ICD-10-CM | POA: Diagnosis not present

## 2024-01-25 DIAGNOSIS — F1120 Opioid dependence, uncomplicated: Secondary | ICD-10-CM | POA: Diagnosis not present

## 2024-01-29 ENCOUNTER — Encounter: Payer: Self-pay | Admitting: Gastroenterology

## 2024-01-31 DIAGNOSIS — H40053 Ocular hypertension, bilateral: Secondary | ICD-10-CM | POA: Diagnosis not present

## 2024-02-08 DIAGNOSIS — F1120 Opioid dependence, uncomplicated: Secondary | ICD-10-CM | POA: Diagnosis not present

## 2024-02-10 ENCOUNTER — Encounter: Payer: Self-pay | Admitting: Gastroenterology

## 2024-02-10 ENCOUNTER — Ambulatory Visit: Admitting: Gastroenterology

## 2024-02-10 VITALS — BP 107/59 | HR 69 | Temp 97.3°F | Resp 12 | Ht 71.0 in | Wt 225.0 lb

## 2024-02-10 DIAGNOSIS — I10 Essential (primary) hypertension: Secondary | ICD-10-CM | POA: Diagnosis not present

## 2024-02-10 DIAGNOSIS — G4730 Sleep apnea, unspecified: Secondary | ICD-10-CM | POA: Diagnosis not present

## 2024-02-10 DIAGNOSIS — E785 Hyperlipidemia, unspecified: Secondary | ICD-10-CM | POA: Diagnosis not present

## 2024-02-10 DIAGNOSIS — D120 Benign neoplasm of cecum: Secondary | ICD-10-CM | POA: Diagnosis not present

## 2024-02-10 DIAGNOSIS — Z1211 Encounter for screening for malignant neoplasm of colon: Secondary | ICD-10-CM

## 2024-02-10 DIAGNOSIS — E119 Type 2 diabetes mellitus without complications: Secondary | ICD-10-CM | POA: Diagnosis not present

## 2024-02-10 MED ORDER — SODIUM CHLORIDE 0.9 % IV SOLN: 500.0000 mL | Freq: Once | INTRAVENOUS | Status: DC

## 2024-02-10 NOTE — Progress Notes (Signed)
 [facility] History and Physical   Primary Care Physician:  Leigh Lung, MD   Reason for Procedure:   Colon cancer screening  Plan:    Screening colonoscopy     HPI: Duane Davis is a 63 y.o. male undergoing average risk screening colonoscopy.  He has no family history of colon cancer and no chronic GI symptoms. He had a colonoscopy at age 80 in [place]  which was normal per patient.   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 Authorizing Provider  allopurinol (ZYLOPRIM) 100 MG tablet Take 100 mg by mouth daily.   Yes [provider]  atorvastatin (LIPITOR) 20 MG tablet Take 20 mg by mouth 3 (three) times a week.   Yes [provider]  Berberine Chloride (BERBERINE HCI PO) Take by mouth daily at 6 (six) AM.   Yes [provider]  hydrochlorothiazide  (MICROZIDE ) 12.5 MG capsule Take 12.5 mg by mouth daily.   Yes [provider]  lisinopril-hydrochlorothiazide  (ZESTORETIC) 10-12.5 MG tablet  04/15/19  Yes [provider]  metFORMIN (GLUCOPHAGE) 500 MG tablet Take by mouth 2 (two) times daily with a meal.   Yes [provider]  methadone (DOLOPHINE) 1 MG/1ML solution Take 53 mg by mouth.   Yes [provider]  Multiple Vitamin (MULTIVITAMIN ADULT PO) Take by mouth.   Yes [provider]  testosterone  (ANDROGEL ) 50 MG/5GM (1%) GEL Place 5 g onto the skin daily.   Yes [provider]  lactobacillus acidophilus (BACID) TABS tablet Take 2 tablets by mouth 3 (three) times daily.     [provider]  methylPREDNISolone  (MEDROL  DOSEPAK) 4 MG TBPK tablet 6,5,4,3,2,1 Patient not taking: Reported on 02/10/2024 11/06/21   Woods, Jaclyn M, PA-C  naproxen  (NAPROSYN ) 500 MG tablet Take 1 tablet (500 mg total) by mouth 2 (two) times daily with a meal. Patient not taking: Reported on 02/10/2024 06/02/20   Joldersma, Logan, PA-C  Semaglutide, 1 MG/DOSE, (OZEMPIC, 1 MG/DOSE,) 4 MG/3ML SOPN Inject into the skin once a week. Saturday    [provider]  Testosterone  20.25 MG/ACT (1.62%) GEL  04/15/19   [provider]    Current Outpatient Medications  Medication Sig Dispense Refill   allopurinol (ZYLOPRIM) 100 MG tablet Take 100 mg by mouth daily.     atorvastatin (LIPITOR) 20 MG tablet Take 20 mg by mouth 3 (three) times a week.     Berberine Chloride (BERBERINE HCI PO) Take by mouth daily at 6 (six) AM.     hydrochlorothiazide  (MICROZIDE ) 12.5 MG capsule Take 12.5 mg by mouth daily.     lisinopril-hydrochlorothiazide  (ZESTORETIC) 10-12.5 MG tablet      metFORMIN (GLUCOPHAGE) 500 MG tablet Take by mouth 2 (two) times daily with a meal.     methadone (DOLOPHINE) 1 MG/1ML solution Take 53 mg by mouth.     Multiple Vitamin (MULTIVITAMIN ADULT PO) Take by mouth.     testosterone  (ANDROGEL ) 50 MG/5GM (  1%) GEL Place 5 g onto the skin daily.     lactobacillus acidophilus (BACID) TABS tablet Take 2 tablets by mouth 3 (three) times daily.     methylPREDNISolone  (MEDROL  DOSEPAK) 4 MG TBPK tablet 6,5,4,3,2,1 (Patient not taking: Reported on 02/10/2024) 21 tablet 0   naproxen  (NAPROSYN ) 500 MG tablet Take 1 tablet (500 mg total) by mouth 2 (two) times daily with a meal. (Patient not taking: Reported on 02/10/2024) 30 tablet 0   Semaglutide, 1 MG/DOSE, (OZEMPIC, 1 MG/DOSE,) 4 MG/3ML SOPN Inject into the skin once a week. Saturday     Testosterone  20.25 MG/ACT (1.62%) GEL  (Patient not taking: Reported on 02/10/2024)     Current Facility-Administered Medications   Medication Dose Route Frequency Provider Last Rate Last Admin   0.9 %  sodium chloride  infusion  500 mL Intravenous Once Stacia Glendia BRAVO, MD        Allergies as of 02/10/2024 - Review Complete 02/10/2024  Allergen Reaction Noted   Zolpidem tartrate Other (See Comments) 07/01/2011    Family History  Problem Relation Age of Onset   Hypertension Mother    Hypertension Maternal Grandmother    Colon polyps Neg Hx    Crohn's disease Neg Hx    Esophageal cancer Neg Hx    Stomach cancer Neg Hx    Rectal cancer Neg Hx     Social History   Socioeconomic History   Marital status: Widowed    Spouse name: Not on file   Number of children: 3   Years of education: Not on file   Highest education level: Not on file  Occupational History   Occupation: disabled  Tobacco Use   Smoking status: Former    Current packs/day: 0.00    Average packs/day: 0.3 packs/day for 8.0 years (2.0 ttl pk-yrs)    Types: Cigarettes    Start date: 05/13/1990    Quit date: 05/13/1998    Years since quitting: 25.7   Smokeless tobacco: Never  Vaping Use   Vaping status: Never Used  Substance and Sexual Activity   Alcohol use: Not Currently    Comment: quit 2000   Drug use: Not Currently   Sexual activity: Not on file  Other Topics Concern   Not on file  Social History Narrative   Not on file   Social Drivers of Health   Financial Resource Strain: Not on file  Food Insecurity: Low Risk  (09/12/2023)   Received from [facility]   Hunger Vital Sign    Within the past 12 months, you worried that your food would run out before you got money to buy more: Never true    Within the past 12 months, the food you bought just didn't last and you didn't have money to get more. : Never true  Transportation Needs: No Transportation Needs (09/12/2023)   Received from [facility]    In the past 12 months, has lack of reliable transportation kept you from medical appointments, meetings, work or  from getting things needed for daily living? : No  Physical Activity: Not on file  Stress: Not on file  Social Connections: Not on file  Intimate Partner Violence: Not on file    Review of Systems:  All other review of systems negative except as mentioned in the HPI.  Physical Exam: Vital signs BP (!) 108/52   Pulse (!) 55   Temp (!) 97.3 F (36.3 C)   Resp 13   Ht 5' 11 (  1.803 m)   Wt 225 lb (102.1 kg)   SpO2 (!) 57%   BMI 31.38 kg/m   General:   Alert,  Well-developed, well-nourished, pleasant and cooperative in NAD Airway:  Mallampati 3 Lungs:  Clear throughout to auscultation.   Heart:  Regular rate and rhythm; no murmurs, clicks, rubs,  or gallops. Abdomen:  Soft, nontender and nondistended. Normal bowel sounds.   Neuro/Psych:  Normal mood and affect. A and O x 3   Makenleigh Crownover E. Stacia, MD [facility]

## 2024-02-10 NOTE — Progress Notes (Signed)
 Called to room to assist during endoscopic procedure.  Patient ID and intended procedure confirmed with present staff. Received instructions for my participation in the procedure from the performing physician.

## 2024-02-10 NOTE — Progress Notes (Signed)
 Pt's states no medical or surgical changes since previsit or office visit.

## 2024-02-10 NOTE — Progress Notes (Signed)
 Report to PACU, RN, vss, BBS= Clear.

## 2024-02-10 NOTE — Patient Instructions (Addendum)
-  Handout on polyps provided -Await pathology results -Repeat colonoscopy in 1 years because the bowel prep was poor. Recommend a 2-day prep with next colonoscopy.  YOU HAD AN ENDOSCOPIC PROCEDURE TODAY AT THE [facility]:   Refer to the procedure report that was given to you for any specific questions about what was found during the examination.  If the procedure report does not answer your questions, please call your gastroenterologist to clarify.  If you requested that your care partner not be given the details of your procedure findings, then the procedure report has been included in a sealed envelope for you to review at your convenience later.  YOU SHOULD EXPECT: Some feelings of bloating in the abdomen. Passage of more gas than usual.  Walking can help get rid of the air that was put into your GI tract during the procedure and reduce the bloating. If you had a lower endoscopy (such as a colonoscopy or flexible sigmoidoscopy) you may notice spotting of blood in your stool or on the toilet paper. If you underwent a bowel prep for your procedure, you may not have a normal bowel movement for a few days.  Please Note:  You might notice some irritation and congestion in your nose or some drainage.  This is from the oxygen used during your procedure.  There is no need for concern and it should clear up in a day or so.  SYMPTOMS TO REPORT IMMEDIATELY:  Following lower endoscopy (colonoscopy or flexible sigmoidoscopy):  Excessive amounts of blood in the stool  Significant tenderness or worsening of abdominal pains  Swelling of the abdomen that is new, acute  Fever of 100F or higher  For urgent or emergent issues, a gastroenterologist can be reached at any hour by calling [phone]. Do not use MyChart messaging for urgent concerns.    DIET:  We do recommend a small meal at first, but then you may proceed to your regular diet.  Drink plenty of fluids but you should avoid alcoholic  beverages for 24 hours.  ACTIVITY:  You should plan to take it easy for the rest of today and you should NOT DRIVE or use heavy machinery until tomorrow (because of the sedation medicines used during the test).    FOLLOW UP: Our staff will call the number listed on your records the next business day following your procedure.  We will call around 7:15- 8:00 am to check on you and address any questions or concerns that you may have regarding the information given to you following your procedure. If we do not reach you, we will leave a message.     If any biopsies were taken you will be contacted by phone or by letter within the next 1-3 weeks.  Please call us  at [phone] if you have not heard about the biopsies in 3 weeks.    SIGNATURES/CONFIDENTIALITY: You and/or your care partner have signed paperwork which will be entered into your electronic medical record.  These signatures attest to the fact that that the information above on your After Visit Summary has been reviewed and is understood.  Full responsibility of the confidentiality of this discharge information lies with you and/or your care-partner.

## 2024-02-10 NOTE — Op Note (Signed)
 [facility] Patient Name: Duane Davis Procedure Date: 02/10/2024 8:28 AM MRN: [id number] Endoscopist: Glendia E. Stacia , MD, [id number] Age: 63 Referring MD:  Date of Birth: 1960-06-27 Gender: Male Account #: [id number] Procedure:                Colonoscopy Indications:              Screening for colorectal malignant neoplasm (last                            colonoscopy was more than 10 years ago) Medicines:                Monitored Anesthesia Care Procedure:                Pre-Anesthesia Assessment:                           - Prior to the procedure, a History and Physical                            was performed, and patient medications and                            allergies were reviewed. The patient's tolerance of                            previous anesthesia was also reviewed. The risks                            and benefits of the procedure and the sedation                            options and risks were discussed with the patient.                            All questions were answered, and informed consent                            was obtained. Prior Anticoagulants: The patient has                            taken no anticoagulant or antiplatelet agents. ASA                            Grade Assessment: III - A patient with severe                            systemic disease. After reviewing the risks and                            benefits, the patient was deemed in satisfactory                            condition to undergo the procedure.  After obtaining informed consent, the colonoscope                            was passed under direct vision. Throughout the                            procedure, the patient's blood pressure, pulse, and                            oxygen saturations were monitored continuously. The                            Olympus Scope SN: [id number] was introduced through                            the anus and  advanced to the the terminal ileum,                            with identification of the appendiceal orifice and                            IC valve. The colonoscopy was performed without                            difficulty. The patient tolerated the procedure                            well. The quality of the bowel preparation was                            poor. The terminal ileum, ileocecal valve,                            appendiceal orifice, and rectum were photographed.                            The bowel preparation used was SUPREP via split                            dose instruction. Scope In: 8:37:23 AM Scope Out: 9:01:03 AM Scope Withdrawal Time: 0 hours 15 minutes 3 seconds  Total Procedure Duration: 0 hours 23 minutes 40 seconds  Findings:                 The perianal and digital rectal examinations were                            normal. Pertinent negatives include normal                            sphincter tone and no palpable rectal lesions.                           A 4 mm polyp was found in the cecum. The polyp was  sessile. The polyp was removed with a cold snare.                            Resection and retrieval were complete. Estimated                            blood loss was minimal.                           Extensive amounts of liquid stool was found in the                            entire colon, interfering with visualization. There                            was copious small solid debris which repeatedly                            clogged the suction channel and made clearance of                            large pools of liquid stool impossible.                           The exam was otherwise normal throughout the                            examined colon.                           The terminal ileum appeared normal.                           The retroflexed view of the distal rectum and anal                            verge  was normal and showed no anal or rectal                            abnormalities. Complications:            No immediate complications. Estimated Blood Loss:     Estimated blood loss was minimal. Impression:               - Preparation of the colon was poor.                           - One 4 mm polyp in the cecum, removed with a cold                            snare. Resected and retrieved.                           - Stool in the entire examined colon.                           -  The examined portion of the ileum was normal.                           - The distal rectum and anal verge are normal on                            retroflexion view. Recommendation:           - Patient has a contact number available for                            emergencies. The signs and symptoms of potential                            delayed complications were discussed with the                            patient. Return to normal activities tomorrow.                            Written discharge instructions were provided to the                            patient.                           - Resume previous diet.                           - Continue present medications.                           - Await pathology results.                           - Repeat colonoscopy in 1 year because the bowel                            preparation was poor.                           - Recommend 2-day bowel prep with next colonoscopy. Kishan Wachsmuth E. Stacia, MD 02/10/2024 9:06:19 AM This report has been signed electronically.

## 2024-02-11 ENCOUNTER — Telehealth: Payer: Self-pay

## 2024-02-11 DIAGNOSIS — F1120 Opioid dependence, uncomplicated: Secondary | ICD-10-CM | POA: Diagnosis not present

## 2024-02-11 NOTE — Telephone Encounter (Signed)
 Attempted to reach patient for follow up phone call. No answer, left voicemail to contact Dr. Milus Alpha office with any questions or concerns.

## 2024-02-12 LAB — SURGICAL PATHOLOGY

## 2024-02-14 ENCOUNTER — Ambulatory Visit: Payer: Self-pay | Admitting: Gastroenterology

## 2024-02-14 NOTE — Progress Notes (Signed)
 Duane Davis,  The polyp which I removed during your recent procedure was proven to be completely benign but is considered a pre-cancerous polyp that MAY have grown into cancer if it had not been removed.  Studies shows that at least 20% of women over age 61 and 30% of men over age 79 have pre-cancerous polyps. Because of the poor bowel prep quality, I recommend you repeat colonoscopy in 1 year, as previously discussed.  If you develop any new rectal bleeding, abdominal pain or significant bowel habit changes, please contact me before then.

## 2024-02-22 DIAGNOSIS — F1120 Opioid dependence, uncomplicated: Secondary | ICD-10-CM | POA: Diagnosis not present

## 2024-03-21 DIAGNOSIS — F1120 Opioid dependence, uncomplicated: Secondary | ICD-10-CM | POA: Diagnosis not present

## 2024-03-24 DIAGNOSIS — F1120 Opioid dependence, uncomplicated: Secondary | ICD-10-CM | POA: Diagnosis not present

## 2024-03-31 ENCOUNTER — Telehealth: Payer: Self-pay

## 2024-03-31 NOTE — Telephone Encounter (Signed)
 Faxed completed reorder from for Ozempic to Dr. Elna Potters at [facility] to review and sign

## 2024-04-01 ENCOUNTER — Telehealth: Payer: Self-pay

## 2024-04-01 NOTE — Progress Notes (Signed)
   03/31/2024  Patient ID: Duane Davis, male   DOB: [birth year], 63 y.o.   MRN: [id number]  Contacted patient regarding referral for medication access from Leigh Lung, MD .   Left patient a voicemail to return my call at their convenience  Heather Factor, PharmD Clinical Pharmacist  [phone]

## 2024-04-01 NOTE — Progress Notes (Signed)
   04/01/2024  Patient ID: Duane Davis, male   DOB: October 18, 1960, 63 y.o.   MRN: [id number]  Contacted patient regarding referral for medication access from Leigh Lung, MD .   Left patient a voicemail to return my call at their convenience  Heather Factor, PharmD Clinical Pharmacist  [phone]
